# Patient Record
Sex: Male | Born: 1935 | Race: White | Hispanic: No | Marital: Married | Smoking: Former smoker
Health system: Southern US, Community
[De-identification: ages and names within clinical notes are randomized; demographics above are authoritative.]

## PROBLEM LIST (undated history)

## (undated) DIAGNOSIS — K5900 Constipation, unspecified: Secondary | ICD-10-CM

## (undated) DIAGNOSIS — M1990 Unspecified osteoarthritis, unspecified site: Secondary | ICD-10-CM

## (undated) DIAGNOSIS — E291 Testicular hypofunction: Secondary | ICD-10-CM

## (undated) DIAGNOSIS — I10 Essential (primary) hypertension: Secondary | ICD-10-CM

## (undated) DIAGNOSIS — E785 Hyperlipidemia, unspecified: Secondary | ICD-10-CM

## (undated) DIAGNOSIS — F319 Bipolar disorder, unspecified: Secondary | ICD-10-CM

## (undated) DIAGNOSIS — E119 Type 2 diabetes mellitus without complications: Secondary | ICD-10-CM

## (undated) DIAGNOSIS — F32A Depression, unspecified: Secondary | ICD-10-CM

## (undated) DIAGNOSIS — N529 Male erectile dysfunction, unspecified: Secondary | ICD-10-CM

## (undated) DIAGNOSIS — I2510 Atherosclerotic heart disease of native coronary artery without angina pectoris: Secondary | ICD-10-CM

## (undated) DIAGNOSIS — G4733 Obstructive sleep apnea (adult) (pediatric): Secondary | ICD-10-CM

## (undated) HISTORY — PX: WISDOM TOOTH EXTRACTION: SHX21

## (undated) HISTORY — DX: Essential (primary) hypertension: I10

## (undated) HISTORY — PX: OTHER SURGICAL HISTORY: SHX169

## (undated) HISTORY — PX: EYE SURGERY: SHX253

## (undated) HISTORY — DX: Atherosclerotic heart disease of native coronary artery without angina pectoris: I25.10

## (undated) HISTORY — DX: Hyperlipidemia, unspecified: E78.5

## (undated) HISTORY — DX: Unspecified osteoarthritis, unspecified site: M19.90

## (undated) HISTORY — DX: Obstructive sleep apnea (adult) (pediatric): G47.33

## (undated) HISTORY — DX: Male erectile dysfunction, unspecified: N52.9

## (undated) HISTORY — DX: Bipolar disorder, unspecified: F31.9

## (undated) HISTORY — DX: Testicular hypofunction: E29.1

## (undated) HISTORY — PX: COLONOSCOPY: SHX174

---

## 1999-06-03 ENCOUNTER — Ambulatory Visit: Admission: RE | Admit: 1999-06-03 | Discharge: 1999-06-03 | Payer: Self-pay | Admitting: *Deleted

## 1999-07-18 ENCOUNTER — Ambulatory Visit: Admission: RE | Admit: 1999-07-18 | Discharge: 1999-07-18 | Payer: Self-pay | Admitting: *Deleted

## 2001-08-11 HISTORY — PX: CARDIAC CATHETERIZATION: SHX172

## 2001-09-27 ENCOUNTER — Inpatient Hospital Stay (HOSPITAL_COMMUNITY): Admission: AD | Admit: 2001-09-27 | Discharge: 2001-09-29 | Payer: Self-pay | Admitting: Internal Medicine

## 2001-09-27 ENCOUNTER — Encounter: Payer: Self-pay | Admitting: Internal Medicine

## 2001-11-02 ENCOUNTER — Encounter (HOSPITAL_COMMUNITY): Admission: RE | Admit: 2001-11-02 | Discharge: 2002-01-31 | Payer: Self-pay | Admitting: *Deleted

## 2002-07-21 ENCOUNTER — Ambulatory Visit (HOSPITAL_COMMUNITY): Admission: RE | Admit: 2002-07-21 | Discharge: 2002-07-21 | Payer: Self-pay | Admitting: Gastroenterology

## 2010-09-05 LAB — URINALYSIS, ROUTINE W REFLEX MICROSCOPIC
Hgb urine dipstick: NEGATIVE
Ketones, ur: NEGATIVE mg/dL
Nitrite: NEGATIVE
Protein, ur: NEGATIVE mg/dL
Specific Gravity, Urine: 1.024 (ref 1.005–1.030)
Urine Glucose, Fasting: NEGATIVE mg/dL
Urobilinogen, UA: 1 mg/dL (ref 0.0–1.0)
pH: 5.5 (ref 5.0–8.0)

## 2010-09-05 LAB — CBC
HCT: 45.5 % (ref 39.0–52.0)
Hemoglobin: 15 g/dL (ref 13.0–17.0)
MCH: 29.2 pg (ref 26.0–34.0)
MCHC: 33 g/dL (ref 30.0–36.0)
MCV: 88.7 fL (ref 78.0–100.0)
Platelets: 182 10*3/uL (ref 150–400)
RBC: 5.13 MIL/uL (ref 4.22–5.81)
RDW: 13.3 % (ref 11.5–15.5)
WBC: 8.7 10*3/uL (ref 4.0–10.5)

## 2010-09-05 LAB — APTT: aPTT: 29 seconds (ref 24–37)

## 2010-09-05 LAB — ABO/RH: ABO/RH(D): A POS

## 2010-09-05 LAB — DIFFERENTIAL
Basophils Absolute: 0.1 10*3/uL (ref 0.0–0.1)
Basophils Relative: 1 % (ref 0–1)
Eosinophils Absolute: 0.2 10*3/uL (ref 0.0–0.7)
Eosinophils Relative: 3 % (ref 0–5)
Lymphocytes Relative: 26 % (ref 12–46)
Lymphs Abs: 2.2 10*3/uL (ref 0.7–4.0)
Monocytes Absolute: 0.7 10*3/uL (ref 0.1–1.0)
Monocytes Relative: 8 % (ref 3–12)
Neutro Abs: 5.4 10*3/uL (ref 1.7–7.7)
Neutrophils Relative %: 63 % (ref 43–77)

## 2010-09-05 LAB — BASIC METABOLIC PANEL
GFR calc non Af Amer: >60 mL/min (ref >60)
Potassium: 4.1 mEq/L (ref 3.5–5.1)
Sodium: 141 mEq/L (ref 135–145)

## 2010-09-05 LAB — TYPE AND SCREEN: ABO/RH(D): A POS

## 2010-09-05 LAB — PROTIME-INR
INR: 0.95 (ref 0.00–1.49)
Prothrombin Time: 12.9 seconds (ref 11.6–15.2)

## 2010-09-09 ENCOUNTER — Inpatient Hospital Stay (HOSPITAL_COMMUNITY)
Admission: RE | Admit: 2010-09-09 | Discharge: 2010-09-12 | DRG: 470 | Disposition: A | Discharge status: Home Health | Payer: Medicare Other | Attending: Orthopedic Surgery | Admitting: Orthopedic Surgery

## 2010-09-09 DIAGNOSIS — Z9861 Coronary angioplasty status: Secondary | ICD-10-CM

## 2010-09-09 DIAGNOSIS — Z87891 Personal history of nicotine dependence: Secondary | ICD-10-CM

## 2010-09-09 DIAGNOSIS — Z7982 Long term (current) use of aspirin: Secondary | ICD-10-CM

## 2010-09-09 DIAGNOSIS — G4733 Obstructive sleep apnea (adult) (pediatric): Secondary | ICD-10-CM | POA: Diagnosis present

## 2010-09-09 DIAGNOSIS — M1710 Unilateral primary osteoarthritis, unspecified knee: Principal | ICD-10-CM | POA: Diagnosis present

## 2010-09-09 DIAGNOSIS — Z7901 Long term (current) use of anticoagulants: Secondary | ICD-10-CM

## 2010-09-09 DIAGNOSIS — I10 Essential (primary) hypertension: Secondary | ICD-10-CM | POA: Diagnosis present

## 2010-09-09 DIAGNOSIS — I2510 Atherosclerotic heart disease of native coronary artery without angina pectoris: Secondary | ICD-10-CM | POA: Diagnosis present

## 2010-09-09 DIAGNOSIS — E7800 Pure hypercholesterolemia, unspecified: Secondary | ICD-10-CM | POA: Diagnosis present

## 2010-09-10 LAB — CBC
MCH: 29.5 pg (ref 26.0–34.0)
MCV: 90.5 fL (ref 78.0–100.0)
Platelets: 172 10*3/uL (ref 150–400)
RDW: 13.8 % (ref 11.5–15.5)

## 2010-09-10 LAB — BASIC METABOLIC PANEL
BUN: 13 mg/dL (ref 6–23)
Chloride: 103 mEq/L (ref 96–112)
Creatinine, Ser: 1.1 mg/dL (ref 0.4–1.5)
GFR calc non Af Amer: >60 mL/min (ref >60)

## 2010-09-10 LAB — PROTIME-INR: Prothrombin Time: 14.7 seconds (ref 11.6–15.2)

## 2010-09-10 NOTE — Op Note (Addendum)
Charles Bonilla, Charles Bonilla               ACCOUNT NO.:  [id number]  MEDICAL RECORD NO.:  [id number]          PATIENT TYPE:  INP  LOCATION:  5014                         FACILITY:  [facility]  PHYSICIAN:  Feliberto Gottron. [name], M.D.   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  POSTOPERATIVE DIAGNOSIS:  End-stage arthritis, right knee with 20-degree flexion contracture and 10-degree varus deformity.  PROCEDURE:  Right total knee arthroplasty using DePuy Sigma RP components, all cemented double batch of DePuy HV cement, 5 right femur, 6 tibia, 10-mm Sigma RP bearing, 41-mm patellar button.  SURGEON:  Feliberto Gottron. [name], MD  FIRST ASSISTANT:  Shirl Harris, PA-C  ANESTHETIC:  General endotracheal.  ESTIMATED BLOOD LOSS:  Minimal.  FLUID REPLACEMENT:  1500 mL of crystalloid.  DRAINS PLACED:  Two medium Hemovacs, Foley catheter.  URINE OUTPUT:  300 mL.  TOURNIQUET TIME:  1 hour and 30 minutes.  INDICATIONS FOR PROCEDURE:  A 77 year old male with end-stage arthritis of both knees with 10-degree varus deformity and 20-degree flexion contractures bilaterally, desires elective total knee arthroplasty. Risks and benefits of surgery have been discussed, question was answered.  The patient was also failed conservative measures with anti- inflammatory medicines, physical therapy, cortisone injections and Viscosupplementation, and now because of severe unremitting pain with the flexion contractures that make him fatigue easily.  He desires elective right total knee arthroplasty.  Risks and benefits of surgery have been discussed, questions answered.  DESCRIPTION OF PROCEDURE:  The patient was identified by armband and received preoperative IV antibiotics in the holding area at [facility].  He then received right femoral nerve block and was taken to operating room #5, appropriate anesthetic monitors were attached and endotracheal anesthesia induced with the patient in supine position. Foot positioner, lateral post applied to the table.  Tourniquet applied high to the right thigh, which was then prepped and draped in usual sterile fashion from the ankle to the tourniquet.  Time-out procedure was performed.  The limb was wrapped with an Esmarch bandage, tourniquet inflated to 350 mmHg and we began the procedure by making the anterior midline incision starting one handbreadth above the patella going over the patella, 1 cm medial to and 5 cm distal to the tibial tubercle through the skin and subcutaneous tissue down to the level of the transverse retinaculum, which was incised and reflected medially.  We then performed medial parapatellar arthrotomy.  Patella was everted, prepatellar fat pad resected.  Superficial medial collateral ligament was elevated from anterior to posterior off the proximal flare of the tibia going down for a distance of almost 6 or 7 cm to make sure that we had fair amount of release on the medial side.  We also removed the peripheral osteophytes from the anterior and medial tibia.  At this point, the knee was hyperflexed and we removed peripheral osteophytes from the patella and femur at this time as well as the notch osteophytes.  The ACL  and the PCL were resected as were the anterior one half of the menisci.  Tibial spine was removed with an osteotome.  We then entered the proximal tibia with the DePuy step drill followed by intramedullary guide and then fixed a 2-degree posterior slope cutting guide to the tibia allowing resection of about 5 mm of bone medially and 12 mm of bone laterally because of flexion contracture.  The proximal tibial cut was then performed after the guide rod had then removed.  We entered the distal femur 2 mm  anterior to the PCL origin with a step drill followed by the intramedullary rod set for a 12-mm distal cut 5 degrees right and pinned along the epicondylar axis.  After we performed the distal femoral cut, we sized for a #5 femur with posterior referencing cutting guide and set at 3 degrees of external rotation. The #5 chamfer cutting block was then screwed into place.  We performed the anterior, posterior and chamfer cuts without difficulty followed by the Sigma RP box cut.  The knee was brought into full extension and a 10- mm block did fit and there was good ligamentous stability noted in extension and flexion.  The patella measured out at almost 29 mm.  The cutting guide was set at 17 and the posterior 12 mm of the patella resected.  We then sized for a 41 button and drilled the patella.  At this point, we removed the posterior one half of the menisci with the knee in full extension with traction as well as many remnants of the PCL.  The knee was then brought up to full flexion and external rotation.  We sized for a #6 tibial baseplate.  This was pinned into place followed by the smokestack, conical reamer and Delta fin keel punch.  We hammered into a place a #5 right distal femoral trial component and drilled the lugs, placed a 41-mm button and took the knee through a range of motion.  Excellent stability was noted from 0 to 130 degrees where there was adipose tissue translating the tibia forward. At this point, all trial components were removed, all bony surfaces were water picked, clean dried with suction and sponges, double batch of DePuy HV cement with 1500 mg of Zinacef was mixed and applied to all bony metallic mating surfaces except for the posterior condyles of the femur itself.  In order, we hammered into place a #6 tibial tray, a #5 right femur and a 41-mm patellar button and removed the excess cement from all components.  We then inserted the 10-mm bearing, held the  knee in full extension with compression as the cement cured and the patella was clamped into place.  Medium Hemovac were placed from the anterolateral approach proximally and the wound irrigated out with normal saline solution pulse lavaged one more time.  Parapatellar arthrotomy was closed with running #1 Vicryl suture, the subcutaneous tissue with 0 and 2-0 undyed Vicryl suture and the skin with skin staples.  Dressing of Xeroform, 4x4 dressings, sponges, Webril and an Ace wrap applied, a short knee immobilizer was then also applied because of the preexisting flexion contracture.  The patient was awakened, extubated and taken to the recovery room after the tourniquet was let down.     Feliberto Gottron. [name], M.D.     Ovid Curd  D:  09/09/2010  T:  09/09/2010  Job:  [id number]  Electronically Signed by Gean Birchwood M.D. on 09/10/2010 08:23:04 PM

## 2010-09-11 LAB — CBC
HCT: 35.5 % — ABNORMAL LOW (ref 39.0–52.0)
Hemoglobin: 11.4 g/dL — ABNORMAL LOW (ref 13.0–17.0)
MCV: 90.8 fL (ref 78.0–100.0)
RDW: 13.7 % (ref 11.5–15.5)
WBC: 14.9 10*3/uL — ABNORMAL HIGH (ref 4.0–10.5)

## 2010-09-12 LAB — CBC
HCT: 32.8 % — ABNORMAL LOW (ref 39.0–52.0)
Hemoglobin: 10.5 g/dL — ABNORMAL LOW (ref 13.0–17.0)
MCH: 28.5 pg (ref 26.0–34.0)
MCHC: 32 g/dL (ref 30.0–36.0)
MCV: 89.1 fL (ref 78.0–100.0)
Platelets: 156 10*3/uL (ref 150–400)
RBC: 3.68 MIL/uL — ABNORMAL LOW (ref 4.22–5.81)
RDW: 13.6 % (ref 11.5–15.5)
WBC: 13.4 10*3/uL — ABNORMAL HIGH (ref 4.0–10.5)

## 2010-10-11 NOTE — Discharge Summary (Signed)
  NAMEJERSON, FURUKAWA               ACCOUNT NO.:  [id number]  MEDICAL RECORD NO.:  [id number]           PATIENT TYPE:  I  LOCATION:  5014                         FACILITY:  [facility]  PHYSICIAN:  Feliberto Gottron. [name], M.D.   DATE OF BIRTH:  [birth year]  DATE OF ADMISSION:  09/09/2010 DATE OF DISCHARGE:  09/12/2010                              DISCHARGE SUMMARY   CHIEF COMPLAINT:  Right knee pain.  HISTORY OF PRESENT ILLNESS:  This is a 77 year old  gentleman who complains of  severe unremitting pain in his right knee despite conservative treatment.  He desires surgical intervention at this time. All risks and benefits of surgery were discussed with the patient.  PAST MEDICAL HISTORY:  Significant for sleep apnea, coronary artery disease, high cholesterol, and hypertension.  PAST SURGICAL HISTORY:  Significant for cardiac stent.  ALLERGIES:  He has no known drug allergies.  SOCIAL HISTORY:  He quit smoking and drinks occasional alcohol.  FAMILY HISTORY:  Noncontributory.  PHYSICAL EXAMINATION:  Examination of the right knee demonstrates a varus deformity.  Range of motion is intact at 110 degrees.  He is neurovascularly intact.  X-rays demonstrate bone-on-bone degenerative joint disease in the medial compartment of the right knee.  PREOPERATIVE LABORATORY DATA:  White blood cells 7.7, platelets 92, PT 12.9, INR 0.9, PTT 29.   Sodium 141, potassium 4.1, chloride 104, glucose 78, BUN 15, creatinine 0.95.  Urinalysis was within normal limits.  HOSPITAL COURSE:  Mr. Fukuda was admitted to [facility] on September 09, 2010, when he underwent right total knee arthroplasty.  The procedure was performed by Dr. Gean Birchwood, and the patient tolerated the procedure well.  A hemovac drain was placed in the right knee.  He was transferred to the floor on Lovenox and Coumadin for DVT prophylaxis.  On the first postoperative day, he was awake and alert, and reporting good pain control. Hemoglobin  was 13.  His drain was pulled without difficulty.  Foley catheter was removed after physical therapy.  On the second postoperative day, he was ambulating with physical therapy.  Hemoglobin was 11.4.  Surgical dressing was changed, and his incision was found to be benign.  There was a blister on the anterior aspect of the tibia just distal to the incision site.  On the third postoperative day, he was eating well and ambulating independently.  Hemoglobin was 10.5. Dressing remained clean, and he was discharged home.  DISPOSITION:  The patient was discharged home on September 12, 2010.  He was weightbearing as tolerated and would return to the clinic in 10 days for xrays and staple removal.  Discharge medicines were as per the HMR with the addition of Percocet and Coumadin.  FINAL DIAGNOSES:  End-stage degenerative joint disease of the right knee.     Shirl Harris, PA   ______________________________ Feliberto Gottron. [name], M.D.    JW/MEDQ  D:  10/09/2010  T:  10/10/2010  Job:  [id number]  Electronically Signed by Shirl Harris PA on 10/10/2010 05:58:13 PM Electronically Signed by Gean Birchwood M.D. on 10/11/2010 06:38:49 AM

## 2010-11-11 ENCOUNTER — Other Ambulatory Visit (HOSPITAL_COMMUNITY): Payer: Medicare Other

## 2010-11-14 ENCOUNTER — Encounter (HOSPITAL_COMMUNITY)
Admission: RE | Admit: 2010-11-14 | Discharge: 2010-11-14 | Disposition: A | Discharge status: Home / Self Care | Payer: Medicare Other | Source: Ambulatory Visit | Attending: Orthopedic Surgery | Admitting: Orthopedic Surgery

## 2010-11-14 LAB — URINALYSIS, ROUTINE W REFLEX MICROSCOPIC
Glucose, UA: NEGATIVE mg/dL
Hgb urine dipstick: NEGATIVE
Specific Gravity, Urine: 1.02 (ref 1.005–1.030)
pH: 6 (ref 5.0–8.0)

## 2010-11-14 LAB — TYPE AND SCREEN: ABO/RH(D): A POS

## 2010-11-14 LAB — BASIC METABOLIC PANEL
BUN: 11 mg/dL (ref 6–23)
Creatinine, Ser: 0.75 mg/dL (ref 0.4–1.5)
GFR calc non Af Amer: >60 mL/min (ref >60)
Glucose, Bld: 87 mg/dL (ref 70–99)

## 2010-11-14 LAB — CBC
HCT: 41.6 % (ref 39.0–52.0)
Hemoglobin: 13.4 g/dL (ref 13.0–17.0)
MCH: 28 pg (ref 26.0–34.0)
MCHC: 32.2 g/dL (ref 30.0–36.0)
MCV: 87 fL (ref 78.0–100.0)

## 2010-11-14 LAB — URINE MICROSCOPIC-ADD ON

## 2010-11-14 LAB — PROTIME-INR: Prothrombin Time: 13.6 seconds (ref 11.6–15.2)

## 2010-11-14 LAB — SURGICAL PCR SCREEN: MRSA, PCR: NEGATIVE

## 2010-11-18 ENCOUNTER — Inpatient Hospital Stay (HOSPITAL_COMMUNITY)
Admission: RE | Admit: 2010-11-18 | Discharge: 2010-11-21 | DRG: 470 | Disposition: A | Discharge status: Home / Self Care | Payer: Medicare Other | Source: Ambulatory Visit | Attending: Orthopedic Surgery | Admitting: Orthopedic Surgery

## 2010-11-18 DIAGNOSIS — G4733 Obstructive sleep apnea (adult) (pediatric): Secondary | ICD-10-CM | POA: Diagnosis present

## 2010-11-18 DIAGNOSIS — J449 Chronic obstructive pulmonary disease, unspecified: Secondary | ICD-10-CM | POA: Diagnosis present

## 2010-11-18 DIAGNOSIS — I10 Essential (primary) hypertension: Secondary | ICD-10-CM | POA: Diagnosis present

## 2010-11-18 DIAGNOSIS — J4489 Other specified chronic obstructive pulmonary disease: Secondary | ICD-10-CM | POA: Diagnosis present

## 2010-11-18 DIAGNOSIS — M1710 Unilateral primary osteoarthritis, unspecified knee: Principal | ICD-10-CM | POA: Diagnosis present

## 2010-11-18 DIAGNOSIS — I2510 Atherosclerotic heart disease of native coronary artery without angina pectoris: Secondary | ICD-10-CM | POA: Diagnosis present

## 2010-11-18 DIAGNOSIS — Z01812 Encounter for preprocedural laboratory examination: Secondary | ICD-10-CM

## 2010-11-18 DIAGNOSIS — Z87891 Personal history of nicotine dependence: Secondary | ICD-10-CM

## 2010-11-18 DIAGNOSIS — Z7901 Long term (current) use of anticoagulants: Secondary | ICD-10-CM

## 2010-11-18 DIAGNOSIS — Z9861 Coronary angioplasty status: Secondary | ICD-10-CM

## 2010-11-18 DIAGNOSIS — E669 Obesity, unspecified: Secondary | ICD-10-CM | POA: Diagnosis present

## 2010-11-19 LAB — CBC
HCT: 35.9 % — ABNORMAL LOW (ref 39.0–52.0)
MCV: 87.1 fL (ref 78.0–100.0)
Platelets: 179 10*3/uL (ref 150–400)
RBC: 4.12 MIL/uL — ABNORMAL LOW (ref 4.22–5.81)
RDW: 14.5 % (ref 11.5–15.5)
WBC: 12.2 10*3/uL — ABNORMAL HIGH (ref 4.0–10.5)

## 2010-11-19 LAB — BASIC METABOLIC PANEL
BUN: 11 mg/dL (ref 6–23)
Chloride: 102 mEq/L (ref 96–112)
GFR calc non Af Amer: >60 mL/min (ref >60)
Potassium: 3.9 mEq/L (ref 3.5–5.1)
Sodium: 135 mEq/L (ref 135–145)

## 2010-11-19 LAB — PROTIME-INR: INR: 1.12 (ref 0.00–1.49)

## 2010-11-19 NOTE — Op Note (Signed)
Charles Bonilla, Charles Bonilla               ACCOUNT NO.:  [id number]  MEDICAL RECORD NO.:  [id number]           PATIENT TYPE:  I  LOCATION:  5021                         FACILITY:  [facility]  PHYSICIAN:  Feliberto Gottron. [name], M.D.   DATE OF BIRTH:  11-May-1934  DATE OF PROCEDURE:  11/18/2010 DATE OF DISCHARGE:                              OPERATIVE REPORT   PREOPERATIVE DIAGNOSIS:  End-stage arthritis, left knee.  POSTOPERATIVE DIAGNOSIS:  End-stage arthritis, left knee.  PROCEDURE:  Left total knee arthroplasty using DePuy Sigma RP components, 6 tibia, 5 femur, 41-mm patellar button, 10-mm Sigma RP bearing double batch of DePuy HV cement with 1500 mg of Zinacef.  SURGEON:  Feliberto Gottron. [name], MD  FIRST ASSISTANT:  Shirl Harris, PA-C  ANESTHETIC:  General endotracheal.  ESTIMATED BLOOD LOSS:  400 mL.  FLUID REPLACEMENT:  2 liters of crystalloid.  DRAINS PLACED:  Foley catheter.  URINE OUTPUT:  300 mL and two medium Hemovacs.  TOURNIQUET TIME:  18 minutes.  INDICATIONS FOR PROCEDURE:  A 77 year old male with end-stage arthritis of the left knee and right total knee few months ago, did have some epidermal blistering of the skin along the leg, so the plan for this knee was to do it primarily without the tourniquet except for when the cement was actually being placed.  Risks and benefits of surgery well known to the patient.  He has bone-on-bone arthritic changes and severe disabling pain.  He has done very well of his right total knee after the skin blisters healed.  DESCRIPTION OF PROCEDURE:  The patient was identified by armband, received preoperative IV antibiotics in the holding area at [facility] followed by a left femoral nerve block, taken to operating room #4, appropriate anesthetic monitors were attached.  General endotracheal anesthesia was induced with the patient in supine position.  Lateral post and foot positioner were applied to the table.  Foley catheter  was inserted.  Tourniquet was applied high of the left thigh, left lower extremity, prepped and draped in usual sterile fashion from the ankle to the midthigh.  Time-out procedure was performed.  We began the operation by making the anterior midline incision starting a handbreadth above the patella, going over the patella 1 cm medial, 2 and 3 cm distal to the tibial tubercle bleeder.  Bleeders in the skin and subcutaneous tissue identified and cauterized.  Transverse retinaculum was incised and reflected medially, medial parapatellar arthrotomy was then accomplished.  Bleeders in the tendon were also cauterized.  Superficial medial collateral ligament elevated from anterior-posterior off the medial flare of the tibia.  Prepatellar fat pad resected, patella everted and the knee hyperflexed.  Medial and lateral menisci were then resected in an open manner as were the cruciate ligaments.  The knee was then externally rotated and hyperflexed exposing the proximal tibia with the patella everted.  We entered the tibial canal in line with the axis of the tibia with a DePuy step drill followed by intramedullary guide rod and 2-degrees posterior slope cutting guide was then pinned allowing resection of 4-5 mm of bone medially and 8-9 mm of bone laterally.  Satisfied with the proximal tibial cut.  We then entered the distal femur 2 mm anterior to the PCL origin with the IM rod.  A 5-degree left distal femoral cutting guide set at 12 mm because of the large size as in the contralateral knee, was then pinned along the epicondylar axis and the distal 12 mm of the femur resected.  We sized for #5 femoral component using the posterior referencing cutting guide and used a 3- degree externally rotated guide because of the tightness medially.  We then pinned in place the chamfer cutting guide, performed the anterior, posterior and chamfer cuts without difficulty followed by the Sigma RP box cut.  The knee  was then brought into full extension allowing [place] to remove the posterior one half of the menisci and checked our extension gap, which was good for a 10-mm bearing.  The flexion gap was also good for a 10-mm bearing.  At this point, the knee was once again hyperflexed.  A trial 6 tibial baseplate was inserted, pinned into place followed by the smokestack conical reamer and Delta fin keel punch.  The patella was measured at 29 mm.  The cutting guide was set at 17 and the posterior 11 mm of the patella resected drilled for 41 button and a trial button was placed.  We then hammered into place a 5 left femoral trial, drilled the lugs, and inserted a 10-mm bearing and took the knee through range of motion from 0 to 130 degrees with good patellar tracking noted and no thumb pressure required.  With the trial components in the knee was then wrapped with an Esmarch bandage, tourniquet inflated to 350 mmHg.  Trial components were removed.  All bony surfaces were water picked, cleaned, dried with suction and sponges.  Double batch of DePuy HV cement was mixed at the back table and applied to all bony and metallic mating surfaces except for the posterior condyles of the femur itself.  We then hammered into place a six tibial baseplate and removed the excess cement, a 5 left femoral component and removed the excess cement.  We inserted the 10-mm Sigma RP bearing, brought the knee to full extension, gave a good squeeze, flexed to 45 and removed more cement that came out at the edges of the femoral and tibial components.  A 41 patellar button was then squeezed into place and excess cement was removed.  The knee was once again thoroughly irrigated out with normal saline solution.  Medium Hemovac drains were placed from anterolateral approach and the tourniquet let down after the cement cured.  Small bleeders were once again identified and cauterized. The parapatellar arthrotomy was closed with running #1  Vicryl suture, the subcutaneous tissue with 0 and 2-0 undyed Vicryl suture and the skin with skin staples.  A dressing of Xeroform, 4x4 dressing, sponges, Webril and Ace wrap applied.  The patient was then awakened, extubated and taken to the recovery room without difficulty.     Feliberto Gottron. [name], M.D.     Ovid Curd  D:  11/18/2010  T:  11/18/2010  Job:  [id number]  Electronically Signed by Gean Birchwood M.D. on 11/19/2010 03:51:05 PM

## 2010-11-20 LAB — CBC
HCT: 35.2 % — ABNORMAL LOW (ref 39.0–52.0)
Hemoglobin: 10.9 g/dL — ABNORMAL LOW (ref 13.0–17.0)
MCV: 88.2 fL (ref 78.0–100.0)
RDW: 14.4 % (ref 11.5–15.5)
WBC: 11.8 10*3/uL — ABNORMAL HIGH (ref 4.0–10.5)

## 2010-11-20 LAB — PROTIME-INR: INR: 1.24 (ref 0.00–1.49)

## 2010-11-21 LAB — PROTIME-INR
INR: 1.17 (ref 0.00–1.49)
Prothrombin Time: 15.1 seconds (ref 11.6–15.2)

## 2010-11-21 LAB — CBC
Hemoglobin: 10.3 g/dL — ABNORMAL LOW (ref 13.0–17.0)
RBC: 3.77 MIL/uL — ABNORMAL LOW (ref 4.22–5.81)

## 2010-12-02 NOTE — Discharge Summary (Signed)
NAMEGRANGER, Charles Bonilla               ACCOUNT NO.:  [id number]  MEDICAL RECORD NO.:  [id number]           PATIENT TYPE:  I  LOCATION:  5021                         FACILITY:  [facility]  PHYSICIAN:  Feliberto Gottron. [name], M.D.   DATE OF BIRTH:  10/29/1933  DATE OF ADMISSION:  11/18/2010 DATE OF DISCHARGE:  11/21/2010                              DISCHARGE SUMMARY   CHIEF COMPLAINT:  Left knee pain.  HISTORY OF PRESENT ILLNESS:  This is a 77 year old gentleman who complains of severe unremitting pain in his left knee despite conservative treatment.  He has history of successful right total knee arthroplasty and now desires a surgical intervention on the contralateral side.  PAST MEDICAL HISTORY:  Significant for sleep apnea, coronary artery disease, high cholesterol and hypertension.  PAST SURGICAL HISTORY:  Significant for cardiac stent and right total knee arthroplasty.  ALLERGIES:  He has no known drug allergies.  SOCIAL HISTORY:  He quit smoking several years ago and drinks occasional alcohol.  FAMILY HISTORY:  Noncontributory.  PHYSICAL EXAMINATION:  Gross examination of the left knee demonstrates a varus deformity.  Range of motion is 5-110 degrees.  He is neurovascularly intact.  X-rays demonstrate bone-on-bone degenerative joint disease in the medial compartment of the left knee.  PREOP LABS:  White blood cells 9.8, red blood cells 4.78, hemoglobin 13.4, hematocrit 41.6, platelets 303,000.  PT 13.6, INR 1.02, PTT 31. Sodium 140, potassium 4.0, chloride 103, glucose 87, BUN 11, creatinine 0.75.  Urinalysis was within normal limits.  HOSPITAL COURSE:  Charles Bonilla was admitted to [facility] on November 18, 2010, when he underwent left total knee arthroplasty, the procedure was performed by Dr. Gean Birchwood and the patient tolerated it well.  Two Hemovac drains were placed into the left knee.  Perioperative Foley catheter was also placed. He was transferred to the floor on Lovenox  and Coumadin for DVT prophylaxis.  On the first postoperative day, he was awake and alert.  He denied any nausea or vomiting and he was reporting good pain control.  Hemoglobin was 11.3.  Foley catheter was taken out after physical therapy.  On the second postoperative day, he was making good progress with physical therapy.  Hemoglobin was 10.9.  Surgical dressing was changed and his incision was benign.  On postoperative day #3, he was eating well and ambulating independently with a walker and was discharged home.  Hemoglobin on that day was 10.3.  DISPOSITION:  The patient was discharged home on November 21, 2010.  He was weightbearing as tolerated and would return to the clinic in 10 days for x-rays and staple removal.  Discharge medicines are as per the HMR with the additions of Percocet and Coumadin.  He would take Coumadin for 2 weeks with a target INR of 1.52-2.0.  FINAL DIAGNOSIS:  End-stage degenerative joint disease of the left knee     Shirl Harris, PA   ______________________________ Feliberto Gottron. [name], M.D.    JW/MEDQ  D:  11/28/2010  T:  11/29/2010  Job:  [id number]  Electronically Signed by Shirl Harris PA on 11/29/2010 01:19:43 PM Electronically Signed by Homero Fellers  Letroy Vazguez M.D. on 12/02/2010 02:15:23 PM

## 2010-12-27 NOTE — Cardiovascular Report (Signed)
[place]  Patient:    Charles Bonilla, Charles Bonilla Visit Number: [id number] MRN: [id number]          Service Type: MED Location: [phone] Attending Physician:  Lillia Mountain Dictated by:   Darci Needle, M.D. Proc. Date: 09/28/01 Admit Date:  09/27/2001   CC:         Meade Maw, M.D.  Thora Lance, M.D.   Cardiac Catheterization  INDICATIONS:  Acute coronary syndrome with high grade stenosis in large ramus intermedius branch prior to bifurcation.  PROCEDURE: 1. Cutting balloon angioplasty. 2. Stent deployment.  DESCRIPTION OF PROCEDURE:  After the diagnostic procedure, the patients digital angiograms were reviewed.  It was felt that the ramus branch was the likely culprit for the patients presenting coronary symptoms.  He was brought back to the catheterization lab, the sheath upgraded to a 7 French sheath utilizing double glove technique and the modified Seldinger technique.  We used a 7 [place] 3.0 Voda guide.  Guide support was marginal.  The BMW wire was used to the cross the stenosis and the lesion and the 6 mm long 3.5 mm diameter cutting balloon was used for predilatation.  We then deployed a 12 mm long 3.5 mm Express II stent to 16 atmospheres which was an effective diameter of 3.9 mm.  There was still very gentle indentation in the superior margin of the stent in the RAO caudal view, however, the lumen was felt to be quite acceptable and there was significant stenosis following two balloon inflations.  Final angiographic result is felt to reflect a less than 10% stenosis.  TIMI grade flow was 2.  The patient received 3500 units of IV heparin.  He had received subcu inoxoperin at 6 a.m. so actually this procedure was being done with the patient still slightly short of six hours following the last Lovenox dose. A double bolus of Integrilin followed by infusion was begun.  ACT postprocedure 236 seconds.  CONCLUSION:   Successful PCI on the ramus intermedius branch with reduction in stenosis from 99% to less than 10% with TIMI grade 3 flow.  PLAN:  Aspirin, Plavix.  Integrilin x18 hours.  Follow renal function. Follow hemoglobin. Dictated by:   Darci Needle, M.D. Attending Physician:  Lillia Mountain DD:  09/28/01 TD:  09/28/01 Job: [id number]

## 2010-12-27 NOTE — Consult Note (Signed)
[place]. [facility]  Patient:    Charles Bonilla, Charles Bonilla Visit Number: [id number] MRN: [id number]          Service Type: MED Location: [phone] Attending Physician:  Lillia Mountain Dictated by:   Meade Maw, M.D. Admit Date:  09/27/2001                            Consultation Report  REFERRING PHYSICIAN:  Thora Lance, M.D.  REASON FOR CONSULTATION:  Chest pain.  HISTORY:  Charles Bonilla is a very pleasant 77 year old gentleman who presented to Dr. Kandyce Rud office on September 27, 2001 with chief complaint of chest pain.  While in [place] he developed a diffuse chest pain described as a pressure sensation.  The chest pain would occur with exertion while walking approximately 100 feet, while taking a shower, etc.  There was associated shortness of breath and general feelings of fatigue.  There was some presyncope but no syncope.  The chest pressure was relieved with rest.  He has had approximately one to two episodes per day.  He also relates a history of increased shortness of breath during the night.  He related this to his CPAP machine being nonfunctional.  His coronary risk factors are significant for male; age; remote history of tobacco use - stopped smoking approximately 10 years ago, had a prior history of one pack per day; sedentary activity. Cholesterol profile is unknown.  PAST MEDICAL HISTORY: 1. Erectile dysfunction. 2. Obesity. 3. Obstructive sleep apnea. 4. Right-sided sciatica.  PAST SURGICAL HISTORY:  None.  PAST INJURIES:  None.  MEDICATIONS PRIOR TO ADMISSION:  None.  MEDICATIONS NOW:  Include Lovenox, aspirin, Lopressor 25 mg p.o. q.12h., sublingual nitroglycerin.  SOCIAL HISTORY:  He is married, lives with his wife.  Frequently travels with his job.  He works as a Systems analyst.  REVIEW OF SYSTEMS:  He has had increased shortness of breath.  No history of bowel or bladder dysfunction.  He  has noted hematochezia in the past.  There is no known syncope, no fevers, no chills.  He has had one episode of a fast heart rate in the setting of excessive caffeine intake.  PHYSICAL EXAMINATION:  VITAL SIGNS:  Blood pressure 120/70, heart rate 80, he is afebrile.  O2 saturation is 96%.  HEENT:  Remarkable for a thick neck; unable to evaluate for neck vein distention.  He does have good carotid upstrokes, no carotid bruits.  PULMONARY:  Reveals breath sounds which are equal and clear to auscultation. No use of accessory muscles.  CARDIOVASCULAR:  Reveals a normal S1, normal S2, regular rate and rhythm.  No rubs, murmurs, or gallops are noted.  ABDOMEN:  Soft, benign, nontender, obese.  No obvious hepatosplenomegaly.  EXTREMITIES:  Revealed distal pulses which are equal and palpable.  There is no peripheral edema.  NEUROLOGIC:  Nonfocal.  Motor is 5/5 throughout.  LABORATORY DATA:  ECG reveals a right bundle-branch block.  No previous EKG for comparison.  White count 11.4, hematocrit 47, platelet count 203.  INR 1.0.  Electrolytes are within normal limits.  Initial CK 236 with MB 7.25, troponin I 0.37.  Chest x-ray reveals no acute process.  IMPRESSION: 1. Chest pain which is typical for cardiac, with borderline elevation of    cardiac enzymes.  Risks, benefits, and options were discussed with the    patient.  We will proceed with cardiac catheterization for further  stratification.  Agree with the ongoing use of sublingual nitroglycerin,    Lopressor, and Lovenox.  A fasting lipid profile will be obtained. 2. Obstructive sleep apnea.  Patient will use his home CPAP machine while    in-hospital. 3. Morbid obesity.  Diet and exercise were discussed with the patient.  He    appears to be motivated to initiate appropriate diet and exercise. Dictated by:   Meade Maw, M.D. Attending Physician:  Lillia Mountain DD:  09/28/01 TD:  09/28/01 Job: [id number]

## 2010-12-27 NOTE — Op Note (Signed)
   Charles Bonilla, Charles Bonilla                           ACCOUNT NO.:  [id number]   MEDICAL RECORD NO.:  [id number]                   PATIENT TYPE:  AMB   LOCATION:  ENDO                                 FACILITY:  [facility]   PHYSICIAN:  James L. Malon Kindle., M.D.          DATE OF BIRTH:  February 19, 1934   DATE OF PROCEDURE:  07/21/2002  DATE OF DISCHARGE:                                 OPERATIVE REPORT   PROCEDURE:  Colonoscopy.   MEDICATIONS:  Fentanyl 50 mcg, Versed 5 mg IV.   ENDOSCOPE:  Olympus adult video colonoscope.   INDICATIONS:  Colon cancer screening.   DESCRIPTION OF PROCEDURE:  The procedure had been explained to the patient  and consent obtained.  With the patient in the left lateral decubitus  position, the Olympus adult video colonoscope inserted, advanced under  direct visualization.  The prep was excellent, and we were able to reach the  cecum without difficulty and the ileocecal valve and appendiceal orifice  seen.  The scope was withdrawn.  No polyps were seen throughout the entire  colon.  There was no significant diverticular disease.  The scope was  withdrawn in the rectum.  The rectum was free of polyps, really had no  significant internal hemorrhoids.   ASSESSMENT:  Normal screening colonoscopy.   PLAN:  Yearly Hemoccults.  Probably will repeat the procedure in 10 years.                                               James L. Malon Kindle., M.D.    Waldron Session  D:  07/21/2002  T:  07/21/2002  Job:  [id number]   cc:   Thora Lance, M.D.  [address]  [place]  [place] [address]  Fax: [phone]

## 2010-12-27 NOTE — Discharge Summary (Signed)
[place]. [facility]  Patient:    Charles Bonilla, Charles Bonilla Visit Number: [id number] MRN: [id number]          Service Type: Attending:  Thora Lance, M.D.                             Discharge Summary  REASON FOR ADMISSION:  This was a 77 year old [place] male who was admitted after developing chest pain over the last 10 days.  He described a pressure in his chest when walking about 100 feet or taking a shower.  This was associated with shortness of breath, dizziness and feeling of fatigue and was relieved within minutes with rest.  He was having about two episodes a day and was admitted with unstable angina.  Significant findings of his examination was unremarkable.  EKG shows normal sinus rhythm, right bundle branch block and left axis deviation.  LABS ON ADMISSION:  CBC with wbc 11.4, hemoglobin 16.0, platelet count 203. PT 13.4, PTT 28.  Chemistry with sodium 142, potassium 4.0, chloride 107, bicarbonate 27, glucose 121, BUN 12, creatinine 0.9, calcium 8.8.  Total protein 7.4, albumin 2.8, AST 34, ALT 37, alkaline phosphatase 52, total bilirubin 0.6. CK 236, CK-MB 7.2, troponin I 0.37.  Lipids with cholesterol 166, triglycerides 120, HDL 42, LDL 100.  HOSPITAL COURSE:  The patient was admitted with unstable angina/non-Q-wave myocardial infarction.  He was placed on Lovenox, aspirin and beta-blocker. He was seen by Meade Maw, M.D., in the cardiology service.  A cardiac catheterization was done on September 28, 2001, and showed normal LV function and critical disease of a moderate-size first diagonal artery. The patient was seen by Darci Needle, M.D., and underwent a successful angioplasty and stent placement of the lesion.  The patient did well post procedure and was discharged to home on September 29, 2001, in good condition.  DISCHARGE MEDICATIONS: 1. Plavix 75 mg one p.o. q.d. four weeks. 2. Nitroglycerin 0.4 mg sublingual p.r.n. chest  pain. 3. Enteric coated aspirin 325 mg once a day. 4. Zocor 10 mg once a day. 5. Multivitamin once a day.  DIET:  Low fat, low cholesterol.  ACTIVITY:  No heavy lifting and no driving the day of or the day following discharge.  FOLLOW-UP:  The patient will follow up on October 06, 2001, with Dr. Hillary Bow.  Attending:  Thora Lance, M.D. DD:  03/01/02 TD:  03/07/02 Job: [id number]

## 2010-12-27 NOTE — H&P (Signed)
[place]. [facility]  Patient:    Charles Bonilla, Charles Bonilla Visit Number: [id number] MRN: [id number]          Service Type: MED Location: [phone] Attending Physician:  Lillia Mountain Dictated by:   Thora Lance, M.D. Admit Date:  09/27/2001                           History and Physical  CHIEF COMPLAINT:  Chest pain.  HISTORY OF PRESENT ILLNESS:  This is a 77 year old [place] male who over the last 10 days, while on business in [place], developed a diffuse chest pain described as a pressure which occurred with exertion such as walking 100 feet or taking a shower.  The pain was associated with shortness of breath, dizziness, and feeling of tiredness.  It is relived by rest within minutes. He is having about two episodes a day.  He has had several episodes that are more localized retrosternal pain while at rest.  He woke up several times while in [place] with shortness of breath which was relieved by sitting up.  ALLERGIES:  No known drug allergies.  CURRENT MEDICATIONS:  Viagra p.r.n. and C-Pap at 35.  PAST MEDICAL HISTORY:  Erectile dysfunction.  Obstructive sleep apnea. History of right-sided sciatica.  Obesity.  PAST SURGICAL HISTORY:  None.  INJURIES:  None.  FAMILY HISTORY:  Father died at age 52 of an enlarged heart.  Mother died at 36 of a stroke.  Brother died of a brain tumor.  Brother had prostate cancer and possibly obstructive sleep apnea.  Sister with breast cancer.  SOCIAL HISTORY:  Married.  No children.  Occupation:  He is a Loss adjuster, chartered.  He smoked a pack a day for 40 years and quit about 10 years ago.  Drug and alcohol:  One to two glasses of wine a day.  REVIEW OF SYSTEMS:  Otherwise negative.  PHYSICAL EXAMINATION:  GENERAL:  An obese white male.  VITAL SIGNS:  Blood pressure is 142/84, heart rate is 76, temperature 97.7.  HEENT:  Pupils are equal, round and reactive to light.   Extraocular movements are intact.  Funduscopic normal.  Tympanic membranes are clear.  Oropharynx clear.  NECK:  Supple.  No bruits.  No JVD.  No lymphadenopathy.  LUNGS:  Clear.  HEART:  Regular rate and rhythm without murmurs, rubs, or gallops.  ABDOMEN:  Soft, nontender.  Normal bowel sounds.  No masses or bruits.  RECTAL:  Deferred.  EXTREMITIES:  Trace pretibial edema bilaterally.  NEUROLOGICAL:  Nonfocal.  LABORATORY DATA:  Laboratories pending.  Chest x-ray pending.  EKG shows normal sinus rhythm, right bundle branch block, and a left axis deviation.  ASSESSMENT:  Probable unstable angina.  PLAN:  Admit.  Cardiology consult.  Lovenox.  Aspirin.  Beta blocker.  Cardiac enzymes. Dictated by:   Thora Lance, M.D. Attending Physician:  Lillia Mountain DD:  09/27/01 TD:  09/27/01 Job: [id number]

## 2010-12-27 NOTE — Cardiovascular Report (Signed)
[place]. [facility]  Patient:    [name], Charles Bonilla Visit Number: [id number] MRN: [id number]          Service Type: MED Location: [phone] Attending Physician:  Lillia Mountain Dictated by:   Meade Maw, M.D. Proc. Date: 09/28/01 Admit Date:  09/27/2001   CC:         Thora Lance, M.D.                        Cardiac Catheterization  PROCEDURE:  Cardiac catheterization.  CARDIOLOGIST:  Meade Maw, M.D.  INDICATION:  Progressive angina with cardiac enzymes.  DESCRIPTION OF PROCEDURE:  After obtaining written informed consent, the patient was brought to the cardiac catheterization lab in the postabsorptive state.  Preoperative sedation was achieved using IV Versed.  The right groin was prepped and draped in the usual sterile fashion.  Local anesthesia was achieved using 1% Xylocaine.  The 6 French hemostasis sheath was placed into the right femoral artery using the modified Seldinger technique.  Selective coronary angiography was performed using a JL4 and JR4 Judkins catheter.  All catheter exchanges were made over a guidewire.  The distal iliac was noted to be markedly tortuous.  There was kinking of the first JL4 catheter.  The kink was relieved under visual fluoroscopy with a guidewire.  Following the procedure, the films were reviewed with Dr. Earleen Newport and it was felt that percutaneous revascularization was indicated on the first diagonal.  FINDINGS:  The aortic pressure was 124/80, LV pressure was 128/14.  There was no gradient noted on pullback.  Single plane ventriculogram revealed normal wall motion with an ejection fraction of 50-60%.  CORONARY ARTERIOGRAPHY:  Left main:  The left main coronary artery bifurcates into the left anterior descending and circumflex vessel.  There was luminal irregularities in the left main coronary artery.  Left anterior descending:  The left anterior descending has diffuse disease and ends  as a tapering vessel.  The left anterior descending gives rise to a moderate to large first diagonal and a small second diagonal.  The first diagonal is totally occluded proximally.  The second diagonal has a 90% ostial lesion.  There is post stenotic dilatation following the first diagonal.  Circumflex vessel:  The circumflex vessel has diffuse luminal irregularity. It is dominant for the posterior circulation.  There is a 40-50% ostial lesion in the circumflex.  The circumflex gives rise to trivial OM-I, OM-II, small OM-III and a trivial OM-IV.  There is 40% mid vessel lesion noted in the circumflex.  The circumflex goes on to end as the posterior lateral branch. The posterior lateral branch has a 70% lesion.  Right coronary artery:  The right coronary artery is nondominant.  The proximal portion of the vessel is mildly ectatic.  There is a 50% mid vessel lesion.  IMPRESSION:  Critical disease involving the moderate size first diagonal with preserved left ventricular function.  The films were reviewed with Dr. Verdis Prime.  He will proceed with percutaneous revascularization .  We will continue to treat the remaining medical disease in the second diagonal, ostial circumflex, distal circumflex and right coronary artery. Dictated by:   Meade Maw, M.D. Attending Physician:  Lillia Mountain DD:  09/28/01 TD:  09/28/01 Job: [id number]

## 2013-06-06 ENCOUNTER — Other Ambulatory Visit: Payer: Self-pay | Admitting: Interventional Cardiology

## 2013-06-18 ENCOUNTER — Encounter: Payer: Self-pay | Admitting: Interventional Cardiology

## 2013-06-18 ENCOUNTER — Encounter: Payer: Self-pay | Admitting: *Deleted

## 2013-06-18 DIAGNOSIS — N529 Male erectile dysfunction, unspecified: Secondary | ICD-10-CM | POA: Insufficient documentation

## 2013-06-18 DIAGNOSIS — F319 Bipolar disorder, unspecified: Secondary | ICD-10-CM | POA: Insufficient documentation

## 2013-06-18 DIAGNOSIS — I2510 Atherosclerotic heart disease of native coronary artery without angina pectoris: Secondary | ICD-10-CM | POA: Insufficient documentation

## 2013-06-18 DIAGNOSIS — I10 Essential (primary) hypertension: Secondary | ICD-10-CM | POA: Insufficient documentation

## 2013-06-18 DIAGNOSIS — M1990 Unspecified osteoarthritis, unspecified site: Secondary | ICD-10-CM | POA: Insufficient documentation

## 2013-06-18 DIAGNOSIS — G4733 Obstructive sleep apnea (adult) (pediatric): Secondary | ICD-10-CM | POA: Insufficient documentation

## 2013-06-18 DIAGNOSIS — E785 Hyperlipidemia, unspecified: Secondary | ICD-10-CM | POA: Insufficient documentation

## 2013-06-22 ENCOUNTER — Encounter: Payer: Self-pay | Admitting: Interventional Cardiology

## 2013-06-22 ENCOUNTER — Ambulatory Visit (INDEPENDENT_AMBULATORY_CARE_PROVIDER_SITE_OTHER): Payer: Medicare Other | Admitting: Interventional Cardiology

## 2013-06-22 VITALS — BP 124/74 | HR 59 | Ht 71.0 in | Wt 327.0 lb

## 2013-06-22 DIAGNOSIS — E785 Hyperlipidemia, unspecified: Secondary | ICD-10-CM

## 2013-06-22 DIAGNOSIS — I10 Essential (primary) hypertension: Secondary | ICD-10-CM

## 2013-06-22 DIAGNOSIS — I25810 Atherosclerosis of coronary artery bypass graft(s) without angina pectoris: Secondary | ICD-10-CM

## 2013-06-22 DIAGNOSIS — I739 Peripheral vascular disease, unspecified: Secondary | ICD-10-CM | POA: Insufficient documentation

## 2013-06-22 DIAGNOSIS — I2510 Atherosclerotic heart disease of native coronary artery without angina pectoris: Secondary | ICD-10-CM

## 2013-06-22 NOTE — Progress Notes (Signed)
Patient ID: JADEN ABREU, male   DOB: 10-26-1933, 79 y.o.   MRN: [id number]    [address] [place], [place]  [address] Phone: [phone] Fax:  [phone]  Date:  06/22/2013   ID:  DACOTAH CABELLO, DOB 07-Jan-1934, MRN [id number]  PCP:  Lillia Mountain, MD   ASSESSMENT: 1. Coronary atherosclerosis, stable without recurrence of angina 2. Hypertension under good control 3. Marked obesity, poorly controlled 4. Hyperlipidemia on therapy and followed by his primary care physician  PLAN:  1. In absence of symptoms, no functional testing is indicated. The patient is relatively active and recently during your up and do a lot of walking without cardiovascular limitations. 2. He is cautioned to call if any chest discomfort or unexplained dyspnea 3. I will otherwise plan to see him back in one year. We discussed arias for lipid control with an LDL of less than 70. I encouraged reduced caloric intake and increase activity to decrease weight.   SUBJECTIVE: COLBIN JOVEL is a 79 y.o. male who is doing well. He has no cardiac complaints. He has not as active as he should be. He recently toured [place] and  did much walking while in [place]. There were no cardiopulmonary limitations. He denies orthopnea PND. There is no peripheral edema. No medication side effects he denies neurological complaints. No claudication to   Wt Readings from Last 3 Encounters:  06/22/13 327 lb (148.326 kg)     Past Medical History  Diagnosis Date  . Coronary atherosclerosis of unspecified type of vessel, native or graft   . Hyperlipidemia   . HTN (hypertension)   . Erectile dysfunction   . Other testicular hypofunction   . Depressed bipolar disorder   . OSA (obstructive sleep apnea)   . CAD (coronary artery disease)      PTCA and stent March 2003  . DJD (degenerative joint disease)     Dr. Luiz Blare    Current Outpatient Prescriptions  Medication Sig Dispense Refill  . amLODipine  (NORVASC) 5 MG tablet Take 5 mg by mouth daily.      Marland Kitchen aspirin 81 MG tablet Take 81 mg by mouth daily.      . Cholecalciferol (VITAMIN D PO) Take by mouth daily.      Marland Kitchen escitalopram (LEXAPRO) 20 MG tablet Take 20 mg by mouth daily.      Marland Kitchen NITROSTAT 0.4 MG SL tablet PLACE 1 TABLET UNDER THE TONGUE EVERY 5 MINUTES FOR CHEST PAIN,UP TO 3 TABLETS  25 tablet  2  . simvastatin (ZOCOR) 20 MG tablet TAKE ONE TABLET DAILY      . spironolactone (ALDACTONE) 25 MG tablet Take 25 mg by mouth daily.       No current facility-administered medications for this visit.    Allergies:   No Known Allergies  Social History:    History   Social History  . Marital Status: Married    Spouse Name: N/A    Number of Children: N/A  . Years of Education: N/A   Occupational History  . Not on file.   Social History Main Topics  . Smoking status: Former Games developer  . Smokeless tobacco: Not on file     Comment: QUIT 63YRS AGO  . Alcohol Use: Yes     Comment: EVERY DAY WINE  . Drug Use: No  . Sexual Activity: Not on file   Other Topics Concern  . Not on file   Social History Narrative  .  No narrative on file   ROS:  Please see the history of present illness.   Denies syncope, prolonged palpitations, and transient neurological symptoms.   All other systems reviewed and negative.   OBJECTIVE: VS:  BP 124/74  Pulse 59  Ht 5\' 11"  (1.803 m)  Wt 327 lb (148.326 kg)  BMI 45.63 kg/m2  SpO2 96% Well nourished, well developed, in no acute distress, marked obesity HEENT: normal Neck: JVD absent. Carotid bruit absent  Cardiac:  normal S1, S2; RRR; no murmur Lungs:  clear to auscultation bilaterally, no wheezing, rhonchi or rales Abd: soft, nontender, no hepatomegaly Ext: Edema absent. Pulses bilateral 2+ Skin: warm and dry Neuro:  CNs 2-12 intact, no focal abnormalities noted  EKG:  Normal sinus rhythm with right bundle branch block and left anterior hemiblock. No change when compared to January 2014 tracing        Signed, Darci Needle III, MD 06/22/2013 2:52 PM

## 2013-06-22 NOTE — Patient Instructions (Signed)
Your physician recommends that you continue on your current medications as directed. Please refer to the Current Medication list given to you today.  Your physician wants you to follow-up in: 1 year You will receive a reminder letter in the mail two months in advance. If you don't receive a letter, please call our office to schedule the follow-up appointment.  Try to maintain an active lifestyle

## 2013-11-02 ENCOUNTER — Other Ambulatory Visit: Payer: Self-pay | Admitting: Internal Medicine

## 2013-11-02 ENCOUNTER — Ambulatory Visit
Admission: RE | Admit: 2013-11-02 | Discharge: 2013-11-02 | Disposition: A | Discharge status: Home / Self Care | Payer: Commercial Managed Care - HMO | Source: Ambulatory Visit | Attending: Internal Medicine | Admitting: Internal Medicine

## 2013-11-02 DIAGNOSIS — M25579 Pain in unspecified ankle and joints of unspecified foot: Secondary | ICD-10-CM

## 2014-01-16 ENCOUNTER — Encounter: Payer: Self-pay | Admitting: Dietician

## 2014-01-16 ENCOUNTER — Encounter: Payer: Medicare PPO | Attending: Internal Medicine | Admitting: Dietician

## 2014-01-16 VITALS — Ht 71.0 in | Wt 304.2 lb

## 2014-01-16 DIAGNOSIS — Z713 Dietary counseling and surveillance: Secondary | ICD-10-CM | POA: Insufficient documentation

## 2014-01-16 DIAGNOSIS — E1165 Type 2 diabetes mellitus with hyperglycemia: Secondary | ICD-10-CM | POA: Diagnosis present

## 2014-01-16 DIAGNOSIS — IMO0002 Reserved for concepts with insufficient information to code with codable children: Secondary | ICD-10-CM

## 2014-01-16 DIAGNOSIS — E118 Type 2 diabetes mellitus with unspecified complications: Principal | ICD-10-CM

## 2014-01-16 NOTE — Patient Instructions (Signed)
-  Physical activity: 15 minutes 2 to 3 days a week  -Fill up on non-starchy vegetables (any veggie except corn, peas, or potatoes)  -Raw or cooked, fresh or frozen!  -225 grams of carbs per day - 15 total choices -3-4 servings at meals and 1 at snacks -1 serving = 15 grams   -Limit saturated fats (high-fat cheese, high-fat dairy, fatty cuts of meat)  -Continue to eat plenty of fiber  -Limit wine to 10 oz per day

## 2014-01-16 NOTE — Progress Notes (Signed)
  Medical Nutrition Therapy:  Appt start time: 1600 end time:  1730.   Assessment:  Primary concerns today: Charles Bonilla is here today with his wife. He was diagnosed with diabetes in April with a HgbA1c of 13%. His wife reports that she has always had an interest in nutrition and she is very involved in the appointment today. Since diagnosis, Charles Bonilla has started taking Metformin and 12 units of insulin at night. He has been checking blood sugars in the morning before breakfast; they are averaging 98-140 mg/dL.     Preferred Learning Style:   No preference indicated   Learning Readiness:   Contemplating   MEDICATIONS: see list, metformin and insulin   DIETARY INTAKE:  Charles Bonilla and his wife report enjoying lots of bread, cheese, and wine.  Usual physical activity: none  Estimated energy needs: 2000 calories 225 g carbohydrates 150 g protein 56 g fat  Progress Towards Goal(s):  Some progress.   Nutritional Diagnosis:  [place]-2.2 Altered nutrition-related laboratory As related to obesity and diagnosis of type 2 diabetes.  As evidenced by HgbA1c 13%.    Intervention:  Nutrition education provided. Explained HgbA1c test. Discussed the importance of exercise. Practiced carbohydrate counting.  Teaching Method Utilized: Visual Auditory Hands on  Handouts given during visit include:  Living well with diabetes booklet  MyPlate  CHO servings (yellow card)  15g CHO + protein snacks  Barriers to learning/adherence to lifestyle change: none  Demonstrated degree of understanding via:  Teach Back   Monitoring/Evaluation:  Dietary intake, exercise, and body weight in 6 week(s).

## 2014-02-28 ENCOUNTER — Encounter: Payer: Medicare PPO | Attending: Internal Medicine | Admitting: Dietician

## 2014-02-28 VITALS — Ht 71.0 in | Wt 300.0 lb

## 2014-02-28 DIAGNOSIS — E119 Type 2 diabetes mellitus without complications: Secondary | ICD-10-CM

## 2014-02-28 DIAGNOSIS — Z713 Dietary counseling and surveillance: Secondary | ICD-10-CM | POA: Insufficient documentation

## 2014-02-28 DIAGNOSIS — IMO0002 Reserved for concepts with insufficient information to code with codable children: Secondary | ICD-10-CM | POA: Diagnosis present

## 2014-02-28 DIAGNOSIS — E118 Type 2 diabetes mellitus with unspecified complications: Principal | ICD-10-CM

## 2014-02-28 DIAGNOSIS — E1165 Type 2 diabetes mellitus with hyperglycemia: Secondary | ICD-10-CM | POA: Diagnosis present

## 2014-02-28 NOTE — Patient Instructions (Addendum)
-  Physical activity: 15 minutes 2 to 3 days a week  -Consider getting a pedometer and setting a step goal  -Fill up on non-starchy vegetables (any veggie except corn, peas, or potatoes)  -Raw or cooked, fresh or frozen!  -Start having 2 slices of cheese bread instead of 4  -225 grams of carbs per day - 15 total choices -3-4 servings at meals and 1 at snacks -1 serving = 15 grams   -Limit saturated fats (high-fat cheese, high-fat dairy, fatty cuts of meat)  -Continue to eat plenty of fiber  -Limit wine to 12 oz per day

## 2014-02-28 NOTE — Progress Notes (Signed)
  Medical Nutrition Therapy:  Appt start time: 1600 end time: 1700   Follow up:  Charles Bonilla returns today with his wife. Per patient report, his most recent HgbA1c was 7% from 13% at diagnosis. He reports that he is now eating 4 pieces of bread instead of 2 with cheese for breakfast. Blood sugar this morning was 96. Now taking 9 units of insulin. His doctor told him to take 1 less unit of insulin per week. Walking 2-3 days a week [facility]   Preferred Learning Style:   No preference indicated   Learning Readiness:   Contemplating   MEDICATIONS: see list, metformin and insulin   DIETARY INTAKE:  Charles Bonilla and his wife report enjoying lots of bread, cheese, and wine.  Usual physical activity: none  Estimated energy needs: 2000 calories 225 g carbohydrates 150 g protein 56 g fat  Progress Towards Goal(s):  Some progress.   Nutritional Diagnosis:  [place]-2.2 Altered nutrition-related laboratory As related to obesity and diagnosis of type 2 diabetes.  As evidenced by HgbA1c 7%.    Intervention:  Nutrition education provided. Explained HgbA1c test. Discussed the importance of exercise. Practiced carbohydrate counting.  Teaching Method Utilized: Visual Auditory Hands on   Barriers to learning/adherence to lifestyle change: none  Demonstrated degree of understanding via:  Teach Back   Monitoring/Evaluation:  Dietary intake, exercise, and body weight in 3 months.

## 2014-06-06 ENCOUNTER — Ambulatory Visit: Payer: Commercial Managed Care - HMO | Admitting: Dietician

## 2014-06-22 ENCOUNTER — Encounter: Payer: Self-pay | Admitting: Interventional Cardiology

## 2014-06-22 ENCOUNTER — Ambulatory Visit (INDEPENDENT_AMBULATORY_CARE_PROVIDER_SITE_OTHER): Payer: Commercial Managed Care - HMO | Admitting: Interventional Cardiology

## 2014-06-22 VITALS — BP 130/80 | HR 57 | Ht 71.0 in | Wt 312.0 lb

## 2014-06-22 DIAGNOSIS — E785 Hyperlipidemia, unspecified: Secondary | ICD-10-CM

## 2014-06-22 DIAGNOSIS — G4733 Obstructive sleep apnea (adult) (pediatric): Secondary | ICD-10-CM

## 2014-06-22 DIAGNOSIS — I25810 Atherosclerosis of coronary artery bypass graft(s) without angina pectoris: Secondary | ICD-10-CM

## 2014-06-22 DIAGNOSIS — I452 Bifascicular block: Secondary | ICD-10-CM

## 2014-06-22 DIAGNOSIS — I2510 Atherosclerotic heart disease of native coronary artery without angina pectoris: Secondary | ICD-10-CM

## 2014-06-22 DIAGNOSIS — I10 Essential (primary) hypertension: Secondary | ICD-10-CM

## 2014-06-22 NOTE — Patient Instructions (Signed)
Your physician recommends that you continue on your current medications as directed. Please refer to the Current Medication list given to you today.  Your physician discussed the importance of regular exercise and recommended that you start or continue a regular exercise program for good health.   Your physician wants you to follow-up in: 1 year You will receive a reminder letter in the mail two months in advance. If you don't receive a letter, please call our office to schedule the follow-up appointment.  

## 2014-06-22 NOTE — Progress Notes (Signed)
Patient ID: Charles Bonilla, male   DOB: 1934-06-23, 80 y.o.   MRN: [id number]    [address] [place], [place]  [address] Phone: [phone] Fax:  [phone]  Date:  06/22/2014   ID:  Charles Downeredro J Twist, DOB 1934-06-23, MRN [id number]  PCP:  Lillia MountainGRIFFIN,JOHN JOSEPH, MD   ASSESSMENT:  1. Coronary artery disease with prior stent 2003, asymptomatic 2. Morbid obesity 3. Diabetes mellitus with vascular complications, currently being treated by Dr. Valentina LucksGriffin 4. Hypertension, controlled, essential 5. Obstructive sleep apnea  PLAN:  1. Aerobic activity 2. No cardiovascular testing necessary 3. If any chest burning, excessive dyspnea, or edema, he should give us a call 4. Clinical follow-up in one year   SUBJECTIVE: Charles Downeredro J Winstanley is a 80 y.o. male who is doing well. Recently diagnosed with diabetes. Gaining weight. He denies chest discomfort. This chronic but stable dyspnea. No lower extremity swelling. He has had no transient neurological complaints.   Wt Readings from Last 3 Encounters:  06/22/14 312 lb (141.522 kg)  02/28/14 300 lb (136.079 kg)  01/16/14 304 lb 3.2 oz (137.984 kg)     Past Medical History  Diagnosis Date  . Coronary atherosclerosis of unspecified type of vessel, native or graft   . Hyperlipidemia   . HTN (hypertension)   . Erectile dysfunction   . Other testicular hypofunction   . Depressed bipolar disorder   . OSA (obstructive sleep apnea)   . CAD (coronary artery disease)      PTCA and stent March 2003  . DJD (degenerative joint disease)     Dr. Luiz BlareGraves    Current Outpatient Prescriptions  Medication Sig Dispense Refill  . amLODipine (NORVASC) 5 MG tablet Take 5 mg by mouth daily.    Marland Kitchen. aspirin 81 MG tablet Take 81 mg by mouth daily.    . Cholecalciferol (VITAMIN D PO) Take by mouth daily. daily    . escitalopram (LEXAPRO) 20 MG tablet Take 20 mg by mouth daily.    Marland Kitchen. LEVEMIR FLEXTOUCH 100 UNIT/ML Pen As directed    . metFORMIN  (GLUCOPHAGE) 500 MG tablet 1 tab twice a day    . NITROSTAT 0.4 MG SL tablet PLACE 1 TABLET UNDER THE TONGUE EVERY 5 MINUTES FOR CHEST PAIN,UP TO 3 TABLETS 25 tablet 2  . simvastatin (ZOCOR) 20 MG tablet TAKE ONE TABLET DAILY    . spironolactone (ALDACTONE) 25 MG tablet Take 25 mg by mouth daily.     No current facility-administered medications for this visit.    Allergies:   No Known Allergies  Social History:  The patient  reports that he has quit smoking. He does not have any smokeless tobacco history on file. He reports that he drinks alcohol. He reports that he does not use illicit drugs.   ROS:  Please see the history of present illness.   Denies claudication. Denies orthopnea. Denies palpitations and syncope. No nausea, vomiting, hematemesis, or hematochezia.   All other systems reviewed and negative.   OBJECTIVE: VS:  BP 130/80 mmHg  Pulse 57  Ht 5\' 11"  (1.803 m)  Wt 312 lb (141.522 kg)  BMI 43.53 kg/m2 Well nourished, well developed, in no acute distress, morbid obesity HEENT: normal Neck: JVD flat. Carotid bruit absent  Cardiac:  normal S1, S2; RRR; no murmur Lungs:  clear to auscultation bilaterally, no wheezing, rhonchi or rales Abd: soft, nontender, no hepatomegaly Ext: Edema absent. Pulses 2+ Skin: warm and dry Neuro:  CNs 2-12 intact,  no focal abnormalities noted  EKG: Right bundle branch block, left anterior hemiblock, and sinus bradycardia       Signed, Darci NeedleHenry W. B. Smith III, MD 06/22/2014 2:33 PM

## 2014-06-22 NOTE — Addendum Note (Signed)
Addended by: Verdis PrimeSMITH, Yuriel Lopezmartinez on: 06/22/2014 05:19 PM   Modules accepted: Level of Service

## 2014-07-18 ENCOUNTER — Ambulatory Visit: Payer: Commercial Managed Care - HMO | Admitting: Dietician

## 2014-08-16 ENCOUNTER — Encounter: Payer: Commercial Managed Care - HMO | Attending: Internal Medicine | Admitting: Dietician

## 2014-08-16 VITALS — Wt 315.0 lb

## 2014-08-16 DIAGNOSIS — E119 Type 2 diabetes mellitus without complications: Secondary | ICD-10-CM | POA: Insufficient documentation

## 2014-08-16 DIAGNOSIS — Z713 Dietary counseling and surveillance: Secondary | ICD-10-CM | POA: Diagnosis not present

## 2014-08-16 NOTE — Patient Instructions (Addendum)
-  Have something to eat every 3-5 hours that you are awake -Keep a snack with carbs+protein   If you have a low blood sugar (<70), have 15 grams of carbohydrate (sugar), wait 15 minutes and test again  Keep exercising!

## 2014-08-16 NOTE — Progress Notes (Signed)
  Medical Nutrition Therapy:  Appt start time: 1205 end time:  1235  Follow up:  Charles Bonilla returns today with his wife having gained a few pounds during the holidays. HgbA1c has dropped to 5.9%. Blood sugars running between 107-125 mg/dL. [facility] (minimum 15 minutes on treadmill or stationary bike). Having 1-2 large slices of bread with cheese and tea for breakfast. Taking 6 units of insulin. Not having any issues with low blood sugars.  Wt Readings from Last 3 Encounters:  08/16/14 315 lb (142.883 kg)  06/22/14 312 lb (141.522 kg)  02/28/14 300 lb (136.079 kg)   Ht Readings from Last 3 Encounters:  06/22/14 5\' 11"  (1.803 m)  02/28/14 5\' 11"  (1.803 m)  01/16/14 5\' 11"  (1.803 m)   Body mass index is 43.95 kg/(m^2). @BMIFA @ Normalized weight-for-age data available only for age 62 to 20 years. Normalized stature-for-age data available only for age 62 to 20 years.   Preferred Learning Style:   No preference indicated   Learning Readiness:   Contemplating   MEDICATIONS: see list, metformin and insulin   DIETARY INTAKE:  Charles Bonilla and his wife report enjoying lots of bread, cheese, and wine.  Usual physical activity: none  Estimated energy needs: 2000 calories 225 g carbohydrates 150 g protein 56 g fat  Progress Towards Goal(s):  Some progress.   Nutritional Diagnosis:  [place] [facility]-2.2 Altered nutrition-related laboratory As related to obesity and diagnosis of type 2 diabetes.  As evidenced by HgbA1c 7%.    Intervention:  Nutrition education provided. Explained HgbA1c test. Discussed the importance of exercise. Practiced carbohydrate counting.  Teaching Method Utilized: Visual Auditory Hands on  Barriers to learning/adherence to lifestyle change: none  Demonstrated degree of understanding via:  Teach Back   Monitoring/Evaluation:  Dietary intake, exercise, and body weight in 3 months.

## 2014-09-28 ENCOUNTER — Ambulatory Visit (INDEPENDENT_AMBULATORY_CARE_PROVIDER_SITE_OTHER): Payer: Commercial Managed Care - HMO | Admitting: Podiatry

## 2014-09-28 ENCOUNTER — Ambulatory Visit (INDEPENDENT_AMBULATORY_CARE_PROVIDER_SITE_OTHER): Payer: Commercial Managed Care - HMO

## 2014-09-28 ENCOUNTER — Encounter: Payer: Self-pay | Admitting: Podiatry

## 2014-09-28 VITALS — BP 173/67 | HR 64 | Resp 16

## 2014-09-28 DIAGNOSIS — M722 Plantar fascial fibromatosis: Secondary | ICD-10-CM

## 2014-09-28 DIAGNOSIS — M779 Enthesopathy, unspecified: Secondary | ICD-10-CM | POA: Diagnosis not present

## 2014-09-28 DIAGNOSIS — L600 Ingrowing nail: Secondary | ICD-10-CM

## 2014-09-28 NOTE — Patient Instructions (Signed)
Diabetes and Foot Care Diabetes may cause you to have problems because of poor blood supply (circulation) to your feet and legs. This may cause the skin on your feet to become thinner, break easier, and heal more slowly. Your skin may become dry, and the skin may peel and crack. You may also have nerve damage in your legs and feet causing decreased feeling in them. You may not notice minor injuries to your feet that could lead to infections or more serious problems. Taking care of your feet is one of the most important things you can do for yourself.  HOME CARE INSTRUCTIONS  Wear shoes at all times, even in the house. Do not go barefoot. Bare feet are easily injured.  Check your feet daily for blisters, cuts, and redness. If you cannot see the bottom of your feet, use a mirror or ask someone for help.  Wash your feet with warm water (do not use hot water) and mild soap. Then pat your feet and the areas between your toes until they are completely dry. Do not soak your feet as this can dry your skin.  Apply a moisturizing lotion or petroleum jelly (that does not contain alcohol and is unscented) to the skin on your feet and to dry, brittle toenails. Do not apply lotion between your toes.  Trim your toenails straight across. Do not dig under them or around the cuticle. File the edges of your nails with an emery board or nail file.  Do not cut corns or calluses or try to remove them with medicine.  Wear clean socks or stockings every day. Make sure they are not too tight. Do not wear knee-high stockings since they may decrease blood flow to your legs.  Wear shoes that fit properly and have enough cushioning. To break in new shoes, wear them for just a few hours a day. This prevents you from injuring your feet. Always look in your shoes before you put them on to be sure there are no objects inside.  Do not cross your legs. This may decrease the blood flow to your feet.  If you find a minor scrape,  cut, or break in the skin on your feet, keep it and the skin around it clean and dry. These areas may be cleansed with mild soap and water. Do not cleanse the area with peroxide, alcohol, or iodine.  When you remove an adhesive bandage, be sure not to damage the skin around it.  If you have a wound, look at it several times a day to make sure it is healing.  Do not use heating pads or hot water bottles. They may burn your skin. If you have lost feeling in your feet or legs, you may not know it is happening until it is too late.  Make sure your health care provider performs a complete foot exam at least annually or more often if you have foot problems. Report any cuts, sores, or bruises to your health care provider immediately. SEEK MEDICAL CARE IF:   You have an injury that is not healing.  You have cuts or breaks in the skin.  You have an ingrown nail.  You notice redness on your legs or feet.  You feel burning or tingling in your legs or feet.  You have pain or cramps in your legs and feet.  Your legs or feet are numb.  Your feet always feel cold. SEEK IMMEDIATE MEDICAL CARE IF:   There is increasing redness,   swelling, or pain in or around a wound.  There is a red line that goes up your leg.  Pus is coming from a wound.  You develop a fever or as directed by your health care provider.  You notice a bad smell coming from an ulcer or wound. Document Released: 07/25/2000 Document Revised: 03/30/2013 Document Reviewed: 01/04/2013 ExitCare Patient Information 2015 [employer]. This information is not intended to replace advice given to you by your health care provider. Make sure you discuss any questions you have with your health care provider.  

## 2014-09-28 NOTE — Progress Notes (Signed)
   Subjective:    Patient ID: Charles Bonilla, male    DOB: Nov 22, 1933, 81 y.o.   MRN: [id number]  HPI Comments: "My feet hurt on the bottom"  Patient c/o tenderness plantar bilateral for several months. He is diabetic. Also, concerned with the 4th toe right. Sits under the 3rd toe.     Review of Systems  All other systems reviewed and are negative.      Objective:   Physical Exam        Assessment & Plan:

## 2014-09-28 NOTE — Progress Notes (Signed)
Subjective:     Patient ID: Charles Bonilla, male   DOB: 1934/05/18, 81 y.o.   MRN: [id number]  HPI patient states I have pain in my feet and pain specifically in my ingrown toenails of my big toes of both feet that I cannot cut out myself or reach. States that they make it hard for him to wear shoe gear   Review of Systems  All other systems reviewed and are negative.      Objective:   Physical Exam  Constitutional: He is oriented to person, place, and time.  Cardiovascular: Intact distal pulses.   Musculoskeletal: Normal range of motion.  Neurological: He is oriented to person, place, and time.  Skin: Skin is warm.  Nursing note and vitals reviewed.  neurovascular status intact with mild diabetes is under excellent control with sugars running around 100 and noted to have good muscle strength and diminished range of motion of the subtalar joint bilateral. Patient is noted to have good digital perfusion is well oriented 3 and I noted incurvated hallux nail borders medial border of the hallux bilateral. 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 I explained to him the risk of procedure and today I infiltrated 60 mg Xylocaine Marcaine mixture and remove the medial border exposing the matrix and applying phenol 3 applications followed by alcohol lavaged and sterile dressing. Gave instructions on soaks and wearing

## 2014-10-12 ENCOUNTER — Ambulatory Visit: Payer: Commercial Managed Care - HMO | Admitting: Podiatry

## 2014-10-23 DIAGNOSIS — H524 Presbyopia: Secondary | ICD-10-CM | POA: Diagnosis not present

## 2014-10-23 DIAGNOSIS — H5203 Hypermetropia, bilateral: Secondary | ICD-10-CM | POA: Diagnosis not present

## 2014-10-23 DIAGNOSIS — E119 Type 2 diabetes mellitus without complications: Secondary | ICD-10-CM | POA: Diagnosis not present

## 2014-10-23 DIAGNOSIS — Z961 Presence of intraocular lens: Secondary | ICD-10-CM | POA: Diagnosis not present

## 2014-11-13 ENCOUNTER — Encounter: Payer: Commercial Managed Care - HMO | Attending: Internal Medicine | Admitting: Dietician

## 2014-11-13 DIAGNOSIS — Z713 Dietary counseling and surveillance: Secondary | ICD-10-CM | POA: Insufficient documentation

## 2014-11-13 DIAGNOSIS — E119 Type 2 diabetes mellitus without complications: Secondary | ICD-10-CM | POA: Insufficient documentation

## 2014-11-13 NOTE — Progress Notes (Signed)
  Medical Nutrition Therapy:  Appt start time: 300 end time:  330  Follow up:  Charles Bonilla returns alone today. He declined weight today but states he weighed 325 lbs on his home scale this morning. He states he does not have an updated HgbA1c. Has not been exercising recently due to ingrown toenail. Back up to 10 units of insulin. Charles Bonilla reports blood sugars of 107-135 mg/dL in the mornings, fasting. No low blood sugars reported but states he sometimes feels "weak" if he doesn't eat throughout the day.   Wt Readings from Last 3 Encounters:  08/16/14 315 lb (142.883 kg)  06/22/14 312 lb (141.522 kg)  02/28/14 300 lb (136.079 kg)   Ht Readings from Last 3 Encounters:  06/22/14 5\' 11"  (1.803 m)  02/28/14 5\' 11"  (1.803 m)  01/16/14 5\' 11"  (1.803 m)   There is no weight on file to calculate BMI. @BMIFA @ Normalized weight-for-age data available only for age 18 to 20 years. Normalized stature-for-age data available only for age 18 to 20 years.   Preferred Learning Style:   No preference indicated   Learning Readiness:   Contemplating   MEDICATIONS: see list, metformin and insulin   DIETARY INTAKE:  Wakes up between 9:30am-12pm.   B: 2-3 thickly sliced pieces of whole wheat bread with cheese with unsweetened hot tea  May go all day without eating until dinnertime. "My wife forces me to eat if she is home." L: Leftovers or veggie and cheese omelet  D: salmon or tilapia with vegetables, sometimes rice or sweet potatoes with wine  S: fruit (berries, peaches, or pineapple) with ricotta   Usual physical activity: none  Estimated energy needs: 2000 calories 225 g carbohydrates 150 g protein 56 g fat  Progress Towards Goal(s):  Some progress.   Nutritional Diagnosis:  [place]-2.2 Altered nutrition-related laboratory As related to obesity and diagnosis of type 2 diabetes.  As evidenced by HgbA1c 7%.    Intervention:  Nutrition education provided.   Teaching Method  Utilized: Visual Auditory Hands on  Barriers to learning/adherence to lifestyle change: none  Demonstrated degree of understanding via:  Teach Back   Monitoring/Evaluation:  Dietary intake, exercise, and body weight prn.

## 2014-11-13 NOTE — Patient Instructions (Addendum)
-  Get back into exercise routine when you can  -Breakfast: Try 1 slice of cheese toast with 1/2 banana and a boiled egg  -Keep working on eating regularly  -Try walnuts, fruit, and/or string cheese

## 2014-11-28 DIAGNOSIS — Z0000 Encounter for general adult medical examination without abnormal findings: Secondary | ICD-10-CM | POA: Diagnosis not present

## 2014-11-28 DIAGNOSIS — I2510 Atherosclerotic heart disease of native coronary artery without angina pectoris: Secondary | ICD-10-CM | POA: Diagnosis not present

## 2014-11-28 DIAGNOSIS — F325 Major depressive disorder, single episode, in full remission: Secondary | ICD-10-CM | POA: Diagnosis not present

## 2014-11-28 DIAGNOSIS — E119 Type 2 diabetes mellitus without complications: Secondary | ICD-10-CM | POA: Diagnosis not present

## 2014-11-28 DIAGNOSIS — E780 Pure hypercholesterolemia: Secondary | ICD-10-CM | POA: Diagnosis not present

## 2014-11-28 DIAGNOSIS — G4733 Obstructive sleep apnea (adult) (pediatric): Secondary | ICD-10-CM | POA: Diagnosis not present

## 2014-11-28 DIAGNOSIS — R1030 Lower abdominal pain, unspecified: Secondary | ICD-10-CM | POA: Diagnosis not present

## 2014-11-28 DIAGNOSIS — I10 Essential (primary) hypertension: Secondary | ICD-10-CM | POA: Diagnosis not present

## 2015-04-03 DIAGNOSIS — G4733 Obstructive sleep apnea (adult) (pediatric): Secondary | ICD-10-CM | POA: Diagnosis not present

## 2015-04-03 DIAGNOSIS — E119 Type 2 diabetes mellitus without complications: Secondary | ICD-10-CM | POA: Diagnosis not present

## 2015-04-03 DIAGNOSIS — Z23 Encounter for immunization: Secondary | ICD-10-CM | POA: Diagnosis not present

## 2015-04-03 DIAGNOSIS — I10 Essential (primary) hypertension: Secondary | ICD-10-CM | POA: Diagnosis not present

## 2015-04-03 DIAGNOSIS — Z794 Long term (current) use of insulin: Secondary | ICD-10-CM | POA: Diagnosis not present

## 2015-07-04 ENCOUNTER — Other Ambulatory Visit: Payer: Self-pay | Admitting: Interventional Cardiology

## 2015-08-14 DIAGNOSIS — E119 Type 2 diabetes mellitus without complications: Secondary | ICD-10-CM | POA: Diagnosis not present

## 2015-08-14 DIAGNOSIS — Z794 Long term (current) use of insulin: Secondary | ICD-10-CM | POA: Diagnosis not present

## 2015-08-14 DIAGNOSIS — I10 Essential (primary) hypertension: Secondary | ICD-10-CM | POA: Diagnosis not present

## 2015-10-15 ENCOUNTER — Encounter: Payer: Self-pay | Admitting: Interventional Cardiology

## 2015-10-15 ENCOUNTER — Ambulatory Visit (INDEPENDENT_AMBULATORY_CARE_PROVIDER_SITE_OTHER): Payer: Commercial Managed Care - HMO | Admitting: Interventional Cardiology

## 2015-10-15 VITALS — BP 118/70 | HR 62 | Ht 71.0 in | Wt 329.6 lb

## 2015-10-15 DIAGNOSIS — I10 Essential (primary) hypertension: Secondary | ICD-10-CM | POA: Diagnosis not present

## 2015-10-15 DIAGNOSIS — E785 Hyperlipidemia, unspecified: Secondary | ICD-10-CM | POA: Diagnosis not present

## 2015-10-15 DIAGNOSIS — I452 Bifascicular block: Secondary | ICD-10-CM

## 2015-10-15 DIAGNOSIS — G4733 Obstructive sleep apnea (adult) (pediatric): Secondary | ICD-10-CM

## 2015-10-15 DIAGNOSIS — I2510 Atherosclerotic heart disease of native coronary artery without angina pectoris: Secondary | ICD-10-CM | POA: Diagnosis not present

## 2015-10-15 NOTE — Patient Instructions (Addendum)
Medication Instructions:  Your physician recommends that you continue on your current medications as directed. Please refer to the Current Medication list given to you today.   Labwork: None ordered  Testing/Procedures: None ordered  Follow-Up: Your physician wants you to follow-up in: 1 year with Dr.Smith You will receive a reminder letter in the mail two months in advance. If you don't receive a letter, please call our office to schedule the follow-up appointment.   Any Other Special Instructions Will Be Listed Below (If Applicable). Your physician discussed the importance of regular exercise and recommended that you start or continue a regular exercise program for good health.       If you need a refill on your cardiac medications before your next appointment, please call your pharmacy.   

## 2015-10-15 NOTE — Progress Notes (Signed)
Cardiology Office Note   Date:  10/15/2015   ID:  Charles Bonilla, DOB [birth year], MRN [id number]  PCP:  Lillia MountainGRIFFIN,JOHN JOSEPH, MD  Cardiologist:  Lesleigh NoeSMITH III,Allard Lightsey W, MD   Chief Complaint  Patient presents with  . scheduled follow up    cad      History of Present Illness: Charles Bonilla is a 82 y.o. male who presents for Bare-metal stent 2003, hypertension, hyperlipidemia, and diabetes.  He has no cardio pulmonary complaints. I gather that he is relatively sedentary although it is difficult to gain that history. He has no orthopnea, PND, lower extremity swelling, palpitations, or syncope.    Past Medical History  Diagnosis Date  . Coronary atherosclerosis of unspecified type of vessel, native or graft   . Hyperlipidemia   . HTN (hypertension)   . Erectile dysfunction   . Other testicular hypofunction   . Depressed bipolar disorder (HCC)   . OSA (obstructive sleep apnea)   . CAD (coronary artery disease)      PTCA and stent March 2003  . DJD (degenerative joint disease)     Dr. Luiz BlareGraves    Past Surgical History  Procedure Laterality Date  . R knee replacement, rowan    . L knee replacement, rowan       Current Outpatient Prescriptions  Medication Sig Dispense Refill  . amLODipine (NORVASC) 5 MG tablet Take 5 mg by mouth daily.    Marland Kitchen. aspirin 81 MG tablet Take 81 mg by mouth daily.    . Cholecalciferol (VITAMIN D PO) Take 1 tablet by mouth daily. daily    . escitalopram (LEXAPRO) 20 MG tablet Take 20 mg by mouth daily.    Marland Kitchen. LEVEMIR FLEXTOUCH 100 UNIT/ML Pen Inject 10 Units into the skin daily. As directed    . metFORMIN (GLUCOPHAGE) 500 MG tablet Take 500 mg by mouth 2 (two) times daily with a meal. 1 tab twice a day    . NITROSTAT 0.4 MG SL tablet PLACE 1 TABLET UNDER THE TONGUE EVERY 5 MINUTES FOR CHEST PAIN,UP TO 3 TABLETS 25 tablet 0  . simvastatin (ZOCOR) 20 MG tablet Take 20 mg by mouth daily at 6 PM. TAKE ONE TABLET DAILY    . spironolactone (ALDACTONE) 25  MG tablet Take 25 mg by mouth daily.     No current facility-administered medications for this visit.    Allergies:   Review of patient's allergies indicates no known allergies.    Social History:  The patient  reports that he has quit smoking. He does not have any smokeless tobacco history on file. He reports that he drinks alcohol. He reports that he does not use illicit drugs.   Family History:  The patient's family history is not on file.    ROS:  Please see the history of present illness.   Otherwise, review of systems are positive for C Pap mask is worn at night causes of sleep apnea. Occasional lower extremity swelling. Sedentary lifestyle..   All other systems are reviewed and negative.    PHYSICAL EXAM: VS:  BP 118/70 mmHg  Pulse 62  Ht 5\' 11"  (1.803 m)  Wt 329 lb 9.6 oz (149.506 kg)  BMI 45.99 kg/m2 , BMI Body mass index is 45.99 kg/(m^2). GEN: Well nourished, well developed, in no acute distressPeriod morbid obesity HEENT: normal Neck: no JVD, carotid bruits, or masses Cardiac: RRR.  There is no murmur, rub, or gallop. There is no edema. Respiratory:  clear to auscultation  bilaterally, normal work of breathing. GI: soft, nontender, nondistended, + BS MS: no deformity or atrophy Skin: warm and dry, no rash Neuro:  Strength and sensation are intact Psych: euthymic mood, full affect   EKG:  EKG is ordered today. The ekg reveals right bundle branch block with left anterior hemiblock, normal sinus rhythm with normal PR interval.   Recent Labs: No results found for requested labs within last 365 days.    Lipid Panel No results found for: CHOL, TRIG, HDL, CHOLHDL, VLDL, LDLCALC, LDLDIRECT    Wt Readings from Last 3 Encounters:  10/15/15 329 lb 9.6 oz (149.506 kg)  08/16/14 315 lb (142.883 kg)  06/22/14 312 lb (141.522 kg)      Other studies Reviewed: Additional studies/ records that were reviewed today include: No electronic health record entries that are  revealing of cardiovascular data.. The findings include only entry is that related to plantar fasciitis evaluation in April 2016..    ASSESSMENT AND PLAN:  1. Coronary artery disease involving native coronary artery of native heart without angina pectoris Asymptomatic following bare-metal stent in 2003.   2. Essential hypertension Very well controlled  3. OSA (obstructive sleep apnea) Uses C Pap daily  4. Hyperlipidemia Followed by primary care  5. Right bundle branch block (RBBB) with left anterior hemiblock Unchanged    Current medicines are reviewed at length with the patient today.  The patient has the following concerns regarding medicines: None.  The following changes/actions have been instituted:    Needs to increase aerobic activity to help control weight  Labs/ tests ordered today include:  No orders of the defined types were placed in this encounter.     Disposition:   FU with HS in 1 year  Signed, Lesleigh Noe, MD  10/15/2015 3:48 PM    [facility] [address] [place], [place], [place]  [address] Phone: [phone]; Fax: [phone]

## 2015-10-16 NOTE — Addendum Note (Signed)
Addended by: Reesa ChewJONES, Shamiyah Ngu G on: 10/16/2015 05:28 PM   Modules accepted: Orders

## 2015-10-29 DIAGNOSIS — H5203 Hypermetropia, bilateral: Secondary | ICD-10-CM | POA: Diagnosis not present

## 2015-10-29 DIAGNOSIS — E119 Type 2 diabetes mellitus without complications: Secondary | ICD-10-CM | POA: Diagnosis not present

## 2015-12-17 DIAGNOSIS — E119 Type 2 diabetes mellitus without complications: Secondary | ICD-10-CM | POA: Diagnosis not present

## 2015-12-17 DIAGNOSIS — I10 Essential (primary) hypertension: Secondary | ICD-10-CM | POA: Diagnosis not present

## 2015-12-17 DIAGNOSIS — Z0000 Encounter for general adult medical examination without abnormal findings: Secondary | ICD-10-CM | POA: Diagnosis not present

## 2015-12-17 DIAGNOSIS — Z6841 Body Mass Index (BMI) 40.0 and over, adult: Secondary | ICD-10-CM | POA: Diagnosis not present

## 2015-12-17 DIAGNOSIS — Z7984 Long term (current) use of oral hypoglycemic drugs: Secondary | ICD-10-CM | POA: Diagnosis not present

## 2015-12-17 DIAGNOSIS — E7800 Pure hypercholesterolemia, unspecified: Secondary | ICD-10-CM | POA: Diagnosis not present

## 2015-12-17 DIAGNOSIS — Z1389 Encounter for screening for other disorder: Secondary | ICD-10-CM | POA: Diagnosis not present

## 2016-04-21 DIAGNOSIS — Z23 Encounter for immunization: Secondary | ICD-10-CM | POA: Diagnosis not present

## 2016-04-21 DIAGNOSIS — I10 Essential (primary) hypertension: Secondary | ICD-10-CM | POA: Diagnosis not present

## 2016-04-21 DIAGNOSIS — Z7984 Long term (current) use of oral hypoglycemic drugs: Secondary | ICD-10-CM | POA: Diagnosis not present

## 2016-04-21 DIAGNOSIS — E119 Type 2 diabetes mellitus without complications: Secondary | ICD-10-CM | POA: Diagnosis not present

## 2016-08-22 DIAGNOSIS — E119 Type 2 diabetes mellitus without complications: Secondary | ICD-10-CM | POA: Diagnosis not present

## 2016-08-22 DIAGNOSIS — I10 Essential (primary) hypertension: Secondary | ICD-10-CM | POA: Diagnosis not present

## 2016-08-22 DIAGNOSIS — Z794 Long term (current) use of insulin: Secondary | ICD-10-CM | POA: Diagnosis not present

## 2016-10-07 DIAGNOSIS — H26491 Other secondary cataract, right eye: Secondary | ICD-10-CM | POA: Diagnosis not present

## 2016-10-07 DIAGNOSIS — E119 Type 2 diabetes mellitus without complications: Secondary | ICD-10-CM | POA: Diagnosis not present

## 2016-10-07 DIAGNOSIS — H35371 Puckering of macula, right eye: Secondary | ICD-10-CM | POA: Diagnosis not present

## 2016-10-07 DIAGNOSIS — H5203 Hypermetropia, bilateral: Secondary | ICD-10-CM | POA: Diagnosis not present

## 2016-11-18 ENCOUNTER — Encounter: Payer: Self-pay | Admitting: Interventional Cardiology

## 2016-11-20 DIAGNOSIS — H26491 Other secondary cataract, right eye: Secondary | ICD-10-CM | POA: Diagnosis not present

## 2016-12-03 NOTE — Progress Notes (Signed)
Cardiology Office Note    Date:  12/04/2016   ID:  Charles Bonilla, Charles Bonilla 03-11-1934, MRN [id number]  PCP:  Lillia Mountain, MD  Cardiologist: Lesleigh Noe, MD   Chief Complaint  Patient presents with  . Coronary Artery Disease    History of Present Illness:  Charles Bonilla is a 83 y.o. male who presents for Bare-metal stent 2003, hypertension, hyperlipidemia, and diabetes.  Charles Bonilla is doing well. Not quite as active as he used to be. He denies chest discomfort orthopnea and PND. There is trace lower extremity edema. No significant palpitations.  Past Medical History:  Diagnosis Date  . CAD (coronary artery disease)     PTCA and stent March 2003  . Coronary atherosclerosis of unspecified type of vessel, native or graft   . Depressed bipolar disorder (HCC)   . DJD (degenerative joint disease)    Dr. Luiz Blare  . Erectile dysfunction   . HTN (hypertension)   . Hyperlipidemia   . OSA (obstructive sleep apnea)   . Other testicular hypofunction     Past Surgical History:  Procedure Laterality Date  . L knee replacement, [name]    . R knee replacement, [name]      Current Medications: Outpatient Medications Prior to Visit  Medication Sig Dispense Refill  . amLODipine (NORVASC) 5 MG tablet Take 5 mg by mouth daily.    . Cholecalciferol (VITAMIN D PO) Take 1 tablet by mouth daily. daily    . escitalopram (LEXAPRO) 20 MG tablet Take 20 mg by mouth daily.    Marland Kitchen LEVEMIR FLEXTOUCH 100 UNIT/ML Pen Inject 10 Units into the skin daily. As directed    . metFORMIN (GLUCOPHAGE) 500 MG tablet Take 500 mg by mouth 2 (two) times daily with a meal. 1 tab twice a day    . NITROSTAT 0.4 MG SL tablet PLACE 1 TABLET UNDER THE TONGUE EVERY 5 MINUTES FOR CHEST PAIN,UP TO 3 TABLETS 25 tablet 0  . simvastatin (ZOCOR) 20 MG tablet Take 20 mg by mouth daily at 6 PM. TAKE ONE TABLET DAILY    . spironolactone (ALDACTONE) 25 MG tablet Take 25 mg by mouth daily.    Marland Kitchen aspirin 81 MG tablet Take 81 mg  by mouth daily.     No facility-administered medications prior to visit.      Allergies:   Patient has no known allergies.   Social History   Social History  . Marital status: Married    Spouse name: N/A  . Number of children: N/A  . Years of education: N/A   Social History Main Topics  . Smoking status: Former Games developer  . Smokeless tobacco: Never Used     Comment: QUIT 46YRS AGO  . Alcohol use Yes     Comment: EVERY DAY WINE  . Drug use: No  . Sexual activity: Not Asked   Other Topics Concern  . None   Social History Narrative  . None     Family History:  The patient's family history is not on file.   ROS:   Please see the history of present illness.    Some stiffness in his joints and hips. This impacts his ambulation.  All other systems reviewed and are negative.   PHYSICAL EXAM:   VS:  BP 112/62 (BP Location: Left Arm)   Pulse 62   Ht  (1.803 m)   Wt (!) 323 lb 6.4 oz (146.7 kg)   BMI 45.11 kg/m  GEN: Well nourished, well developed, in no acute distress  HEENT: normal  Neck: no JVD, carotid bruits, or masses Cardiac: RRR; no murmurs, rubs, or gallops,no edema  Respiratory:  clear to auscultation bilaterally, normal work of breathing GI: soft, nontender, nondistended, + BS MS: no deformity or atrophy  Skin: warm and dry, no rash Neuro:  Alert and Oriented x 3, Strength and sensation are intact Psych: euthymic mood, full affect  Wt Readings from Last 3 Encounters:  12/04/16 (!) 323 lb 6.4 oz (146.7 kg)  10/15/15 (!) 329 lb 9.6 oz (149.5 kg)  08/16/14 (!) 315 lb (142.9 kg)      Studies/Labs Reviewed:   EKG:  EKG  No change in chronic 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 Sinus rhythm. PR interval is normal. No change compared to one year ago.  Recent Labs: No results found for requested labs within last 8760 hours.   Lipid Panel No results found for: CHOL, TRIG, HDL, CHOLHDL, VLDL, LDLCALC,  LDLDIRECT  Additional studies/ records that were reviewed today include:  No recent data    ASSESSMENT:    1. Coronary artery disease involving native coronary artery of native heart without angina pectoris   2. Essential hypertension   3. Right bundle branch block (RBBB) with left anterior hemiblock   4. Other hyperlipidemia      PLAN:  In order of problems listed above:  1. Javelle has not had much success weight control. He is not as active as he previously while his. He denies chest pain. No episodes of syncope. 2. Excellent control on the current medical regimen. Salt restriction was discussed. 3. Unchanged from prior tracing. No increase in PR interval. 4. Followed by Dr. Valentina Lucks with target LDL less than 70.  Decrease aspirin 81 mg per day. Increase physical activity. Be mindful of the efforts for rate control. Clinical follow-up in one year.    Medication Adjustments/Labs and Tests Ordered: Current medicines are reviewed at length with the patient today.  Concerns regarding medicines are outlined above.  Medication changes, Labs and Tests ordered today are listed in the Patient Instructions below. There are no Patient Instructions on file for this visit.   Signed, Lesleigh Noe, MD  12/04/2016 2:43 PM    [facility] [address] [place], [place], [place]  [address] Phone: [phone]; Fax: [phone]

## 2016-12-04 ENCOUNTER — Ambulatory Visit (INDEPENDENT_AMBULATORY_CARE_PROVIDER_SITE_OTHER): Payer: Medicare HMO | Admitting: Interventional Cardiology

## 2016-12-04 ENCOUNTER — Encounter: Payer: Self-pay | Admitting: Interventional Cardiology

## 2016-12-04 VITALS — BP 112/62 | HR 62 | Ht 71.0 in | Wt 323.4 lb

## 2016-12-04 DIAGNOSIS — I10 Essential (primary) hypertension: Secondary | ICD-10-CM

## 2016-12-04 DIAGNOSIS — I452 Bifascicular block: Secondary | ICD-10-CM

## 2016-12-04 DIAGNOSIS — E784 Other hyperlipidemia: Secondary | ICD-10-CM | POA: Diagnosis not present

## 2016-12-04 DIAGNOSIS — E7849 Other hyperlipidemia: Secondary | ICD-10-CM

## 2016-12-04 DIAGNOSIS — I2510 Atherosclerotic heart disease of native coronary artery without angina pectoris: Secondary | ICD-10-CM | POA: Diagnosis not present

## 2016-12-04 NOTE — Patient Instructions (Signed)

## 2016-12-16 DIAGNOSIS — Z1389 Encounter for screening for other disorder: Secondary | ICD-10-CM | POA: Diagnosis not present

## 2016-12-16 DIAGNOSIS — Z0000 Encounter for general adult medical examination without abnormal findings: Secondary | ICD-10-CM | POA: Diagnosis not present

## 2016-12-16 DIAGNOSIS — F325 Major depressive disorder, single episode, in full remission: Secondary | ICD-10-CM | POA: Diagnosis not present

## 2017-04-08 DIAGNOSIS — I10 Essential (primary) hypertension: Secondary | ICD-10-CM | POA: Diagnosis not present

## 2017-04-08 DIAGNOSIS — E119 Type 2 diabetes mellitus without complications: Secondary | ICD-10-CM | POA: Diagnosis not present

## 2017-04-08 DIAGNOSIS — E1165 Type 2 diabetes mellitus with hyperglycemia: Secondary | ICD-10-CM | POA: Diagnosis not present

## 2017-04-08 DIAGNOSIS — I5032 Chronic diastolic (congestive) heart failure: Secondary | ICD-10-CM | POA: Diagnosis not present

## 2017-04-08 DIAGNOSIS — Z6841 Body Mass Index (BMI) 40.0 and over, adult: Secondary | ICD-10-CM | POA: Diagnosis not present

## 2017-04-08 DIAGNOSIS — Z794 Long term (current) use of insulin: Secondary | ICD-10-CM | POA: Diagnosis not present

## 2017-04-08 DIAGNOSIS — Z7984 Long term (current) use of oral hypoglycemic drugs: Secondary | ICD-10-CM | POA: Diagnosis not present

## 2017-04-29 DIAGNOSIS — Z23 Encounter for immunization: Secondary | ICD-10-CM | POA: Diagnosis not present

## 2017-06-19 DIAGNOSIS — M79604 Pain in right leg: Secondary | ICD-10-CM | POA: Diagnosis not present

## 2017-08-14 DIAGNOSIS — E119 Type 2 diabetes mellitus without complications: Secondary | ICD-10-CM | POA: Diagnosis not present

## 2017-08-14 DIAGNOSIS — Z7984 Long term (current) use of oral hypoglycemic drugs: Secondary | ICD-10-CM | POA: Diagnosis not present

## 2017-08-14 DIAGNOSIS — I10 Essential (primary) hypertension: Secondary | ICD-10-CM | POA: Diagnosis not present

## 2017-09-07 DIAGNOSIS — Z794 Long term (current) use of insulin: Secondary | ICD-10-CM | POA: Diagnosis not present

## 2017-09-07 DIAGNOSIS — E1165 Type 2 diabetes mellitus with hyperglycemia: Secondary | ICD-10-CM | POA: Diagnosis not present

## 2017-09-07 DIAGNOSIS — Z7984 Long term (current) use of oral hypoglycemic drugs: Secondary | ICD-10-CM | POA: Diagnosis not present

## 2017-09-10 DIAGNOSIS — I10 Essential (primary) hypertension: Secondary | ICD-10-CM | POA: Diagnosis not present

## 2017-09-10 DIAGNOSIS — I2510 Atherosclerotic heart disease of native coronary artery without angina pectoris: Secondary | ICD-10-CM | POA: Diagnosis not present

## 2017-09-10 DIAGNOSIS — I5032 Chronic diastolic (congestive) heart failure: Secondary | ICD-10-CM | POA: Diagnosis not present

## 2017-09-10 DIAGNOSIS — E119 Type 2 diabetes mellitus without complications: Secondary | ICD-10-CM | POA: Diagnosis not present

## 2017-09-10 DIAGNOSIS — E1165 Type 2 diabetes mellitus with hyperglycemia: Secondary | ICD-10-CM | POA: Diagnosis not present

## 2017-09-10 DIAGNOSIS — F325 Major depressive disorder, single episode, in full remission: Secondary | ICD-10-CM | POA: Diagnosis not present

## 2017-09-10 DIAGNOSIS — M179 Osteoarthritis of knee, unspecified: Secondary | ICD-10-CM | POA: Diagnosis not present

## 2017-09-10 DIAGNOSIS — Z794 Long term (current) use of insulin: Secondary | ICD-10-CM | POA: Diagnosis not present

## 2017-09-14 DIAGNOSIS — E119 Type 2 diabetes mellitus without complications: Secondary | ICD-10-CM | POA: Diagnosis not present

## 2017-09-14 DIAGNOSIS — E7800 Pure hypercholesterolemia, unspecified: Secondary | ICD-10-CM | POA: Diagnosis not present

## 2017-09-14 DIAGNOSIS — I10 Essential (primary) hypertension: Secondary | ICD-10-CM | POA: Diagnosis not present

## 2017-09-14 DIAGNOSIS — E1165 Type 2 diabetes mellitus with hyperglycemia: Secondary | ICD-10-CM | POA: Diagnosis not present

## 2017-09-14 DIAGNOSIS — I5032 Chronic diastolic (congestive) heart failure: Secondary | ICD-10-CM | POA: Diagnosis not present

## 2017-09-14 DIAGNOSIS — Z7984 Long term (current) use of oral hypoglycemic drugs: Secondary | ICD-10-CM | POA: Diagnosis not present

## 2017-09-22 DIAGNOSIS — E119 Type 2 diabetes mellitus without complications: Secondary | ICD-10-CM | POA: Diagnosis not present

## 2017-09-22 DIAGNOSIS — I10 Essential (primary) hypertension: Secondary | ICD-10-CM | POA: Diagnosis not present

## 2017-09-22 DIAGNOSIS — E1165 Type 2 diabetes mellitus with hyperglycemia: Secondary | ICD-10-CM | POA: Diagnosis not present

## 2017-09-22 DIAGNOSIS — Z794 Long term (current) use of insulin: Secondary | ICD-10-CM | POA: Diagnosis not present

## 2017-09-22 DIAGNOSIS — F325 Major depressive disorder, single episode, in full remission: Secondary | ICD-10-CM | POA: Diagnosis not present

## 2017-09-22 DIAGNOSIS — I5032 Chronic diastolic (congestive) heart failure: Secondary | ICD-10-CM | POA: Diagnosis not present

## 2017-09-22 DIAGNOSIS — M179 Osteoarthritis of knee, unspecified: Secondary | ICD-10-CM | POA: Diagnosis not present

## 2017-09-22 DIAGNOSIS — I2510 Atherosclerotic heart disease of native coronary artery without angina pectoris: Secondary | ICD-10-CM | POA: Diagnosis not present

## 2017-12-11 DIAGNOSIS — I10 Essential (primary) hypertension: Secondary | ICD-10-CM | POA: Diagnosis not present

## 2017-12-11 DIAGNOSIS — G4733 Obstructive sleep apnea (adult) (pediatric): Secondary | ICD-10-CM | POA: Diagnosis not present

## 2017-12-11 DIAGNOSIS — E7800 Pure hypercholesterolemia, unspecified: Secondary | ICD-10-CM | POA: Diagnosis not present

## 2017-12-11 DIAGNOSIS — Z794 Long term (current) use of insulin: Secondary | ICD-10-CM | POA: Diagnosis not present

## 2017-12-11 DIAGNOSIS — Z6841 Body Mass Index (BMI) 40.0 and over, adult: Secondary | ICD-10-CM | POA: Diagnosis not present

## 2017-12-11 DIAGNOSIS — I5032 Chronic diastolic (congestive) heart failure: Secondary | ICD-10-CM | POA: Diagnosis not present

## 2017-12-11 DIAGNOSIS — K921 Melena: Secondary | ICD-10-CM | POA: Diagnosis not present

## 2017-12-11 DIAGNOSIS — R319 Hematuria, unspecified: Secondary | ICD-10-CM | POA: Diagnosis not present

## 2017-12-11 DIAGNOSIS — I110 Hypertensive heart disease with heart failure: Secondary | ICD-10-CM | POA: Diagnosis not present

## 2017-12-11 DIAGNOSIS — F325 Major depressive disorder, single episode, in full remission: Secondary | ICD-10-CM | POA: Diagnosis not present

## 2017-12-11 DIAGNOSIS — E1169 Type 2 diabetes mellitus with other specified complication: Secondary | ICD-10-CM | POA: Diagnosis not present

## 2017-12-11 DIAGNOSIS — I2510 Atherosclerotic heart disease of native coronary artery without angina pectoris: Secondary | ICD-10-CM | POA: Diagnosis not present

## 2017-12-11 DIAGNOSIS — Z0000 Encounter for general adult medical examination without abnormal findings: Secondary | ICD-10-CM | POA: Diagnosis not present

## 2017-12-11 DIAGNOSIS — R8290 Unspecified abnormal findings in urine: Secondary | ICD-10-CM | POA: Diagnosis not present

## 2017-12-11 DIAGNOSIS — Z1389 Encounter for screening for other disorder: Secondary | ICD-10-CM | POA: Diagnosis not present

## 2017-12-14 DIAGNOSIS — R319 Hematuria, unspecified: Secondary | ICD-10-CM | POA: Diagnosis not present

## 2017-12-14 DIAGNOSIS — R8290 Unspecified abnormal findings in urine: Secondary | ICD-10-CM | POA: Diagnosis not present

## 2017-12-16 DIAGNOSIS — G4733 Obstructive sleep apnea (adult) (pediatric): Secondary | ICD-10-CM | POA: Diagnosis not present

## 2017-12-16 DIAGNOSIS — I10 Essential (primary) hypertension: Secondary | ICD-10-CM | POA: Diagnosis not present

## 2017-12-16 DIAGNOSIS — R194 Change in bowel habit: Secondary | ICD-10-CM | POA: Diagnosis not present

## 2017-12-16 DIAGNOSIS — Z7984 Long term (current) use of oral hypoglycemic drugs: Secondary | ICD-10-CM | POA: Diagnosis not present

## 2017-12-16 DIAGNOSIS — E119 Type 2 diabetes mellitus without complications: Secondary | ICD-10-CM | POA: Diagnosis not present

## 2017-12-16 DIAGNOSIS — K625 Hemorrhage of anus and rectum: Secondary | ICD-10-CM | POA: Diagnosis not present

## 2017-12-23 DIAGNOSIS — K921 Melena: Secondary | ICD-10-CM | POA: Diagnosis not present

## 2018-01-06 ENCOUNTER — Other Ambulatory Visit: Payer: Self-pay | Admitting: Gastroenterology

## 2018-01-06 DIAGNOSIS — K921 Melena: Secondary | ICD-10-CM

## 2018-01-15 DIAGNOSIS — E119 Type 2 diabetes mellitus without complications: Secondary | ICD-10-CM | POA: Diagnosis not present

## 2018-01-15 DIAGNOSIS — H524 Presbyopia: Secondary | ICD-10-CM | POA: Diagnosis not present

## 2018-01-15 DIAGNOSIS — R3129 Other microscopic hematuria: Secondary | ICD-10-CM | POA: Diagnosis not present

## 2018-01-15 DIAGNOSIS — Z961 Presence of intraocular lens: Secondary | ICD-10-CM | POA: Diagnosis not present

## 2018-01-19 ENCOUNTER — Ambulatory Visit
Admission: RE | Admit: 2018-01-19 | Discharge: 2018-01-19 | Disposition: A | Discharge status: Home / Self Care | Payer: Medicare HMO | Source: Ambulatory Visit | Attending: Gastroenterology | Admitting: Gastroenterology

## 2018-01-19 ENCOUNTER — Other Ambulatory Visit: Payer: Self-pay | Admitting: Gastroenterology

## 2018-01-19 DIAGNOSIS — N200 Calculus of kidney: Secondary | ICD-10-CM | POA: Diagnosis not present

## 2018-01-19 DIAGNOSIS — K921 Melena: Secondary | ICD-10-CM

## 2018-01-24 NOTE — Progress Notes (Signed)
Cardiology Office Note    Date:  01/25/2018   ID:  Charles Bonilla, DOB November 09, 1933, MRN [id number]  PCP:  Kirby FunkGriffin, John, MD  Cardiologist: Lesleigh NoeHenry W Arnette Driggs III, MD   No chief complaint on file.   History of Present Illness:  Charles Bonilla is a 84 y.o. male  who presents for Bare-metal stent (6 () 2003, hypertension, hyperlipidemia, and diabetes.  He is doing well.  He denies chest pain dyspnea.  No orthopnea, PND, edema, or other complaints.  Has not had palpitations.  Occasionally has left anterior thigh discomfort.  Present both sitting and standing.  Does not bother him that much with walking.   Past Medical History:  Diagnosis Date  . CAD (coronary artery disease)     PTCA and stent March 2003  . Coronary atherosclerosis of unspecified type of vessel, native or graft   . Depressed bipolar disorder (HCC)   . DJD (degenerative joint disease)    Dr. Luiz BlareGraves  . Erectile dysfunction   . HTN (hypertension)   . Hyperlipidemia   . OSA (obstructive sleep apnea)   . Other testicular hypofunction     Past Surgical History:  Procedure Laterality Date  . L knee replacement, [name]    . R knee replacement, Turner [place]an      Current Medications: Outpatient Medications Prior to Visit  Medication Sig Dispense Refill  . amLODipine (NORVASC) 5 MG tablet Take 5 mg by mouth daily.    Marland Kitchen. aspirin EC 81 MG tablet Take 81 mg by mouth daily.    . Cholecalciferol (VITAMIN D PO) Take 1 tablet by mouth daily. daily    . escitalopram (LEXAPRO) 20 MG tablet Take 20 mg by mouth daily.    . Insulin Glargine (TOUJEO SOLOSTAR [place]) Inject 18 Units into the skin daily.    . metFORMIN (GLUCOPHAGE) 500 MG tablet Take 500 mg by mouth 2 (two) times daily with a meal.     . NITROSTAT 0.4 MG SL tablet PLACE 1 TABLET UNDER THE TONGUE EVERY 5 MINUTES FOR CHEST PAIN,UP TO 3 TABLETS 25 tablet 0  . simvastatin (ZOCOR) 20 MG tablet Take 20 mg by mouth daily at 6 PM.     . spironolactone (ALDACTONE) 25 MG tablet Take 25  mg by mouth daily.    Marland Kitchen. aspirin 325 MG tablet Take 325 mg by mouth daily.    Marland Kitchen. LEVEMIR FLEXTOUCH 100 UNIT/ML Pen Inject 10 Units into the skin daily. As directed    . TOUJEO SOLOSTAR 300 UNIT/ML SOPN Inject 300 mLs as directed as directed.     No facility-administered medications prior to visit.      Allergies:   Patient has no known allergies.   Social History   Socioeconomic History  . Marital status: Married    Spouse name: Not on file  . Number of children: Not on file  . Years of education: Not on file  . Highest education level: Not on file  Occupational History  . Not on file  Social Needs  . Financial resource strain: Not on file  . Food insecurity:    Worry: Not on file    Inability: Not on file  . Transportation needs:    Medical: Not on file    Non-medical: Not on file  Tobacco Use  . Smoking status: Former Games developermoker  . Smokeless tobacco: Never Used  . Tobacco comment: QUIT 76YRS AGO  Substance and Sexual Activity  . Alcohol use: Yes    Comment:  EVERY DAY WINE  . Drug use: No  . Sexual activity: Not on file  Lifestyle  . Physical activity:    Days per week: Not on file    Minutes per session: Not on file  . Stress: Not on file  Relationships  . Social connections:    Talks on phone: Not on file    Gets together: Not on file    Attends religious service: Not on file    Active member of club or organization: Not on file    Attends meetings of clubs or organizations: Not on file    Relationship status: Not on file  Other Topics Concern  . Not on file  Social History Narrative  . Not on file     Family History:  The patient's family history is not on file.   ROS:   Please see the history of present illness.    Occasional low back discomfort.  Coincidental finding of asymptomatic kidney stone when a colonoscopy and barium enema were performed. All other systems reviewed and are negative.   PHYSICAL EXAM:   VS:  BP 112/62   Pulse 61   Ht 5' 10.5"  (1.791 m)   Wt (!) 313 lb 6.4 oz (142.2 kg)   SpO2 92%   BMI 44.33 kg/m    GEN: Morbid obesity, well developed, in no acute distress  HEENT: normal  Neck: no JVD, carotid bruits, or masses Cardiac: RRR; no murmurs, rubs, or gallops,no edema  Respiratory:  clear to auscultation bilaterally, normal work of breathing GI: soft, nontender, nondistended, + BS MS: no deformity or atrophy  Skin: warm and dry, no rash Neuro:  Alert and Oriented x 3, Strength and sensation are intact Psych: euthymic mood, full affect  Wt Readings from Last 3 Encounters:  01/25/18 (!) 313 lb 6.4 oz (142.2 kg)  12/04/16 (!) 323 lb 6.4 oz (146.7 kg)  10/15/15 (!) 329 lb 9.6 oz (149.5 kg)      Studies/Labs Reviewed:   EKG:  EKG sinus rhythm, right bundle, left anterior hemiblock, and when compared to prior tracings, no significant change has occurred since the April 2018 study.  Recent Labs: No results found for requested labs within last 8760 hours.   Lipid Panel No results found for: CHOL, TRIG, HDL, CHOLHDL, VLDL, LDLCALC, LDLDIRECT  Additional studies/ records that were reviewed today include:  No recent imaging studies.    ASSESSMENT:    1. Coronary artery disease involving native coronary artery of native heart without angina pectoris   2. Depressed bipolar disorder (HCC)   3. Essential hypertension   4. Other hyperlipidemia   5. Right bundle branch block (RBBB) with left anterior hemiblock      PLAN:  In order of problems listed above:  1. Bare-metal stent in 2003.  No symptoms since that time.  Did not have a myocardial infarction.  Had dyspnea and chest discomfort on exertion. 2. Has bifascicular block with no change on EKG since the last tracing. 3. Excellent blood pressure control.  Target less than 130/80 mmHg. 4. LDL of 40.  Continue current regimen with simvastatin 20.  I encouraged the patient be more physically active and include at least 3 [facility]  He will try.  Clinical follow-up in 1 year.    Medication Adjustments/Labs and Tests Ordered: Current medicines are reviewed at length with the patient today.  Concerns regarding medicines are  outlined above.  Medication changes, Labs and Tests ordered today are listed in the Patient Instructions below. Patient Instructions  Medication Instructions:  Your physician recommends that you continue on your current medications as directed. Please refer to the Current Medication list given to you today.   Labwork: None  Testing/Procedures: None  Follow-Up: Your physician wants you to follow-up in: 1 year with Dr. Katrinka Blazing.  You will receive a reminder letter in the mail two months in advance. If you don't receive a letter, please call our office to schedule the follow-up appointment.   Any Other Special Instructions Will Be Listed Below (If Applicable).     If you need a refill on your cardiac medications before your next appointment, please call your pharmacy.      Signed, Lesleigh Noe, MD  01/25/2018 2:19 PM    [facility] [address] [place], [place], [place]  [address] Phone: [phone]; Fax: [phone]

## 2018-01-25 ENCOUNTER — Encounter: Payer: Self-pay | Admitting: Interventional Cardiology

## 2018-01-25 ENCOUNTER — Ambulatory Visit: Payer: Medicare HMO | Admitting: Interventional Cardiology

## 2018-01-25 VITALS — BP 112/62 | HR 61 | Ht 70.5 in | Wt 313.4 lb

## 2018-01-25 DIAGNOSIS — F319 Bipolar disorder, unspecified: Secondary | ICD-10-CM | POA: Diagnosis not present

## 2018-01-25 DIAGNOSIS — Z794 Long term (current) use of insulin: Secondary | ICD-10-CM | POA: Diagnosis not present

## 2018-01-25 DIAGNOSIS — I10 Essential (primary) hypertension: Secondary | ICD-10-CM | POA: Diagnosis not present

## 2018-01-25 DIAGNOSIS — I452 Bifascicular block: Secondary | ICD-10-CM | POA: Diagnosis not present

## 2018-01-25 DIAGNOSIS — E119 Type 2 diabetes mellitus without complications: Secondary | ICD-10-CM | POA: Diagnosis not present

## 2018-01-25 DIAGNOSIS — I2510 Atherosclerotic heart disease of native coronary artery without angina pectoris: Secondary | ICD-10-CM | POA: Diagnosis not present

## 2018-01-25 DIAGNOSIS — E7849 Other hyperlipidemia: Secondary | ICD-10-CM | POA: Diagnosis not present

## 2018-01-25 NOTE — Patient Instructions (Signed)

## 2018-02-15 ENCOUNTER — Other Ambulatory Visit: Payer: Self-pay | Admitting: Internal Medicine

## 2018-02-15 ENCOUNTER — Ambulatory Visit
Admission: RE | Admit: 2018-02-15 | Discharge: 2018-02-15 | Disposition: A | Discharge status: Home / Self Care | Payer: Medicare HMO | Source: Ambulatory Visit | Attending: Internal Medicine | Admitting: Internal Medicine

## 2018-02-15 DIAGNOSIS — M25551 Pain in right hip: Secondary | ICD-10-CM

## 2018-02-15 DIAGNOSIS — M1611 Unilateral primary osteoarthritis, right hip: Secondary | ICD-10-CM | POA: Diagnosis not present

## 2018-03-01 DIAGNOSIS — R3129 Other microscopic hematuria: Secondary | ICD-10-CM | POA: Diagnosis not present

## 2018-03-26 ENCOUNTER — Ambulatory Visit: Payer: Medicare HMO | Admitting: Interventional Cardiology

## 2018-03-26 ENCOUNTER — Encounter

## 2018-04-06 DIAGNOSIS — K5900 Constipation, unspecified: Secondary | ICD-10-CM | POA: Diagnosis not present

## 2018-04-06 DIAGNOSIS — K921 Melena: Secondary | ICD-10-CM | POA: Diagnosis not present

## 2018-04-08 DIAGNOSIS — N400 Enlarged prostate without lower urinary tract symptoms: Secondary | ICD-10-CM | POA: Diagnosis not present

## 2018-04-08 DIAGNOSIS — R311 Benign essential microscopic hematuria: Secondary | ICD-10-CM | POA: Diagnosis not present

## 2018-04-19 DIAGNOSIS — K8020 Calculus of gallbladder without cholecystitis without obstruction: Secondary | ICD-10-CM | POA: Diagnosis not present

## 2018-04-19 DIAGNOSIS — R311 Benign essential microscopic hematuria: Secondary | ICD-10-CM | POA: Diagnosis not present

## 2018-05-03 DIAGNOSIS — I10 Essential (primary) hypertension: Secondary | ICD-10-CM | POA: Diagnosis not present

## 2018-05-03 DIAGNOSIS — Z23 Encounter for immunization: Secondary | ICD-10-CM | POA: Diagnosis not present

## 2018-05-03 DIAGNOSIS — E1169 Type 2 diabetes mellitus with other specified complication: Secondary | ICD-10-CM | POA: Diagnosis not present

## 2018-06-03 DIAGNOSIS — R311 Benign essential microscopic hematuria: Secondary | ICD-10-CM | POA: Diagnosis not present

## 2018-06-10 ENCOUNTER — Other Ambulatory Visit: Payer: Self-pay | Admitting: Internal Medicine

## 2018-06-10 DIAGNOSIS — K869 Disease of pancreas, unspecified: Secondary | ICD-10-CM

## 2018-06-20 ENCOUNTER — Ambulatory Visit
Admission: RE | Admit: 2018-06-20 | Discharge: 2018-06-20 | Disposition: A | Discharge status: Home / Self Care | Payer: Medicare HMO | Source: Ambulatory Visit | Attending: Internal Medicine | Admitting: Internal Medicine

## 2018-06-20 DIAGNOSIS — K760 Fatty (change of) liver, not elsewhere classified: Secondary | ICD-10-CM | POA: Diagnosis not present

## 2018-06-20 DIAGNOSIS — K869 Disease of pancreas, unspecified: Secondary | ICD-10-CM

## 2018-06-20 DIAGNOSIS — K8020 Calculus of gallbladder without cholecystitis without obstruction: Secondary | ICD-10-CM | POA: Diagnosis not present

## 2018-06-20 MED ORDER — GADOBENATE DIMEGLUMINE 529 MG/ML IV SOLN
20.0000 mL | Freq: Once | INTRAVENOUS | Status: AC | PRN
  Administered 2018-06-20: 20 mL via INTRAVENOUS

## 2018-07-02 ENCOUNTER — Other Ambulatory Visit: Payer: Self-pay | Admitting: Internal Medicine

## 2018-07-02 ENCOUNTER — Ambulatory Visit
Admission: RE | Admit: 2018-07-02 | Discharge: 2018-07-02 | Disposition: A | Discharge status: Home / Self Care | Payer: Medicare HMO | Source: Ambulatory Visit | Attending: Internal Medicine | Admitting: Internal Medicine

## 2018-07-02 DIAGNOSIS — M542 Cervicalgia: Secondary | ICD-10-CM | POA: Diagnosis not present

## 2018-07-02 DIAGNOSIS — S058X1A Other injuries of right eye and orbit, initial encounter: Secondary | ICD-10-CM | POA: Diagnosis not present

## 2018-09-08 ENCOUNTER — Encounter: Payer: Self-pay | Admitting: Internal Medicine

## 2018-09-08 DIAGNOSIS — I110 Hypertensive heart disease with heart failure: Secondary | ICD-10-CM | POA: Diagnosis not present

## 2018-09-08 DIAGNOSIS — I2510 Atherosclerotic heart disease of native coronary artery without angina pectoris: Secondary | ICD-10-CM | POA: Diagnosis not present

## 2018-09-08 DIAGNOSIS — G4733 Obstructive sleep apnea (adult) (pediatric): Secondary | ICD-10-CM | POA: Diagnosis not present

## 2018-09-08 DIAGNOSIS — Z794 Long term (current) use of insulin: Secondary | ICD-10-CM | POA: Diagnosis not present

## 2018-09-08 DIAGNOSIS — I10 Essential (primary) hypertension: Secondary | ICD-10-CM | POA: Diagnosis not present

## 2018-09-08 DIAGNOSIS — E1169 Type 2 diabetes mellitus with other specified complication: Secondary | ICD-10-CM | POA: Diagnosis not present

## 2018-09-08 DIAGNOSIS — Z6841 Body Mass Index (BMI) 40.0 and over, adult: Secondary | ICD-10-CM | POA: Diagnosis not present

## 2018-09-08 DIAGNOSIS — I5032 Chronic diastolic (congestive) heart failure: Secondary | ICD-10-CM | POA: Diagnosis not present

## 2018-09-10 DIAGNOSIS — E1165 Type 2 diabetes mellitus with hyperglycemia: Secondary | ICD-10-CM | POA: Diagnosis not present

## 2018-09-10 DIAGNOSIS — Z794 Long term (current) use of insulin: Secondary | ICD-10-CM | POA: Diagnosis not present

## 2018-09-19 IMAGING — RF DG BE W/ CM - WO/W KUB
1 series · 15 of 16 positions shown · IV contrast (agent unspecified)
Comparison: None.

CLINICAL DATA: Blood in stool.  Incomplete colonoscopy

EXAM:
BE WITH CONTRAST - WITHOUT AND WITH KUB
CONTRAST:  Single-contrast barium enema
FLUOROSCOPY TIME:  Fluoroscopy Time:  2 minutes 18 seconds
Radiation Exposure Index (if provided by the fluoroscopic device):
Number of Acquired Spot Images: 0

[Series 1: one shot · 15 of 16 slices shown]
[im 1/16]
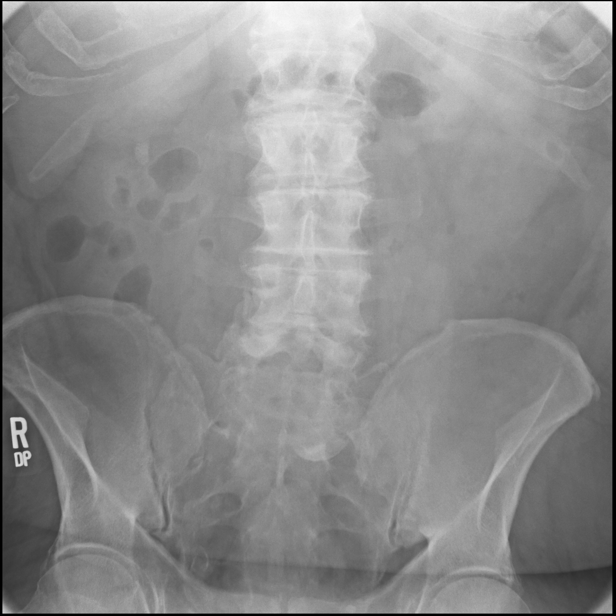
[im 2/16]
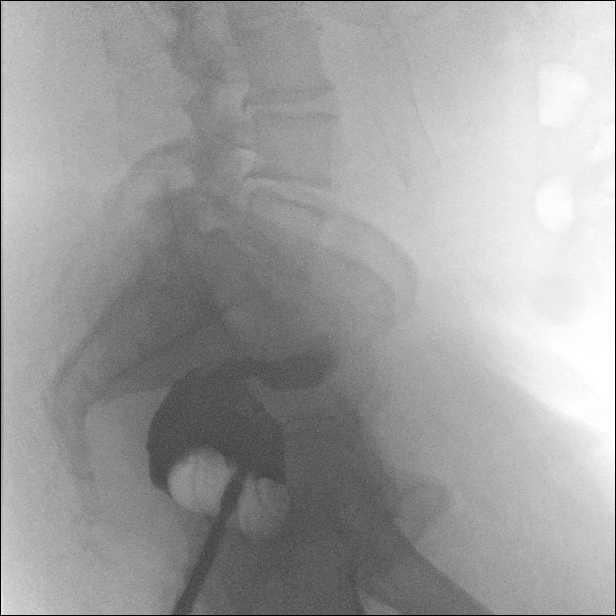
[im 3/16]
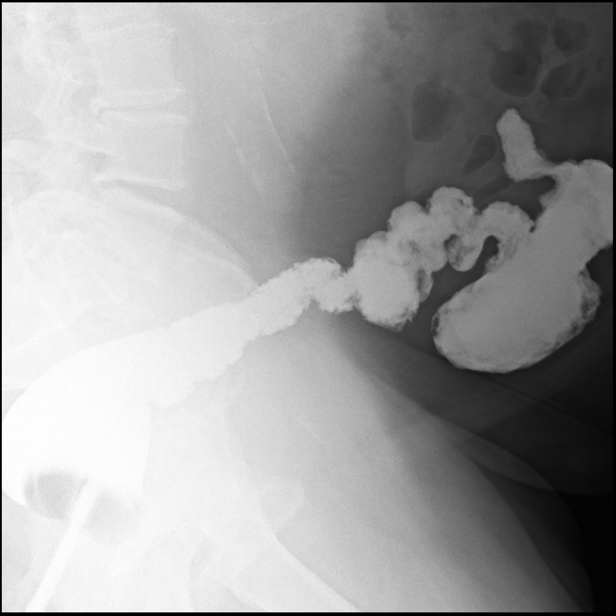
[im 4/16]
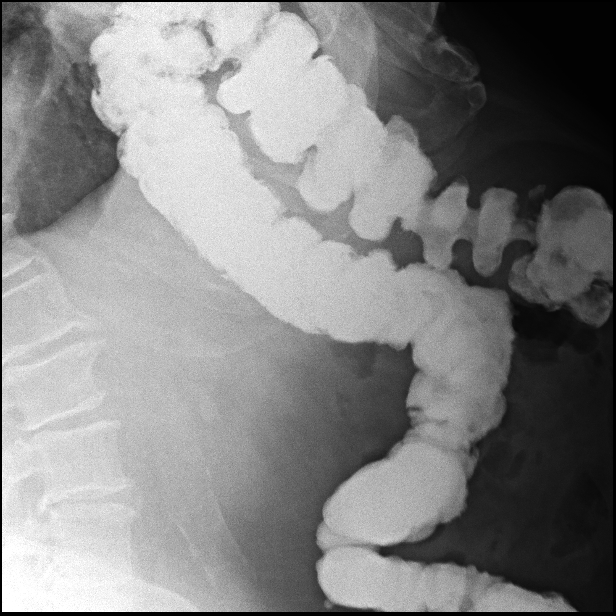
[im 5/16]
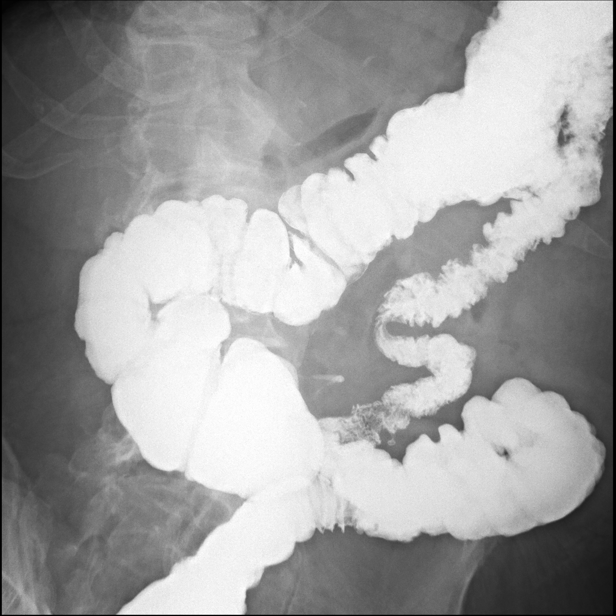
[im 6/16]
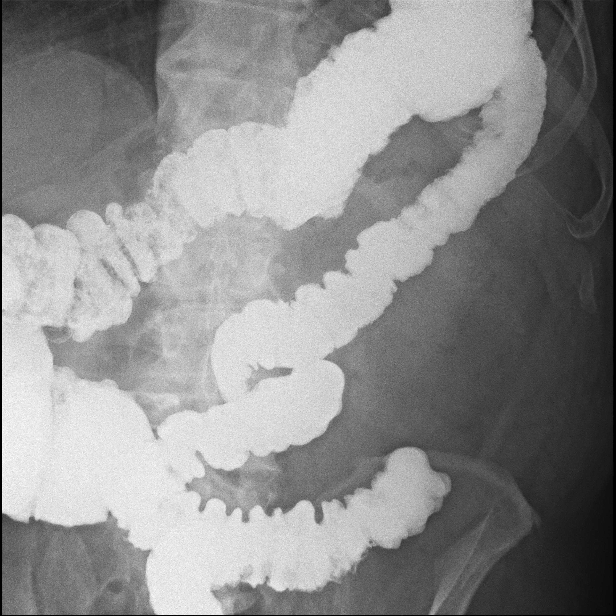
[im 7/16]
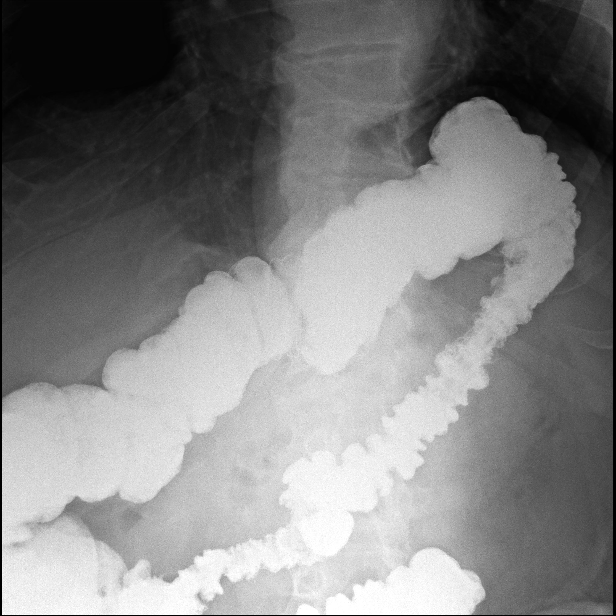
[im 9/16]
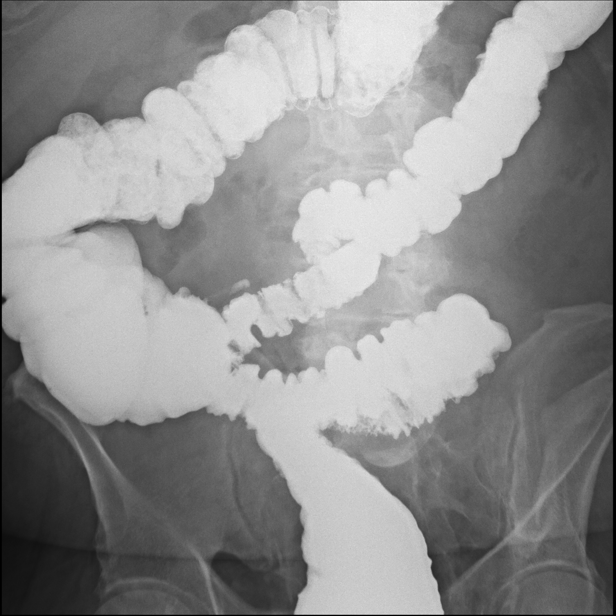
[im 10/16]
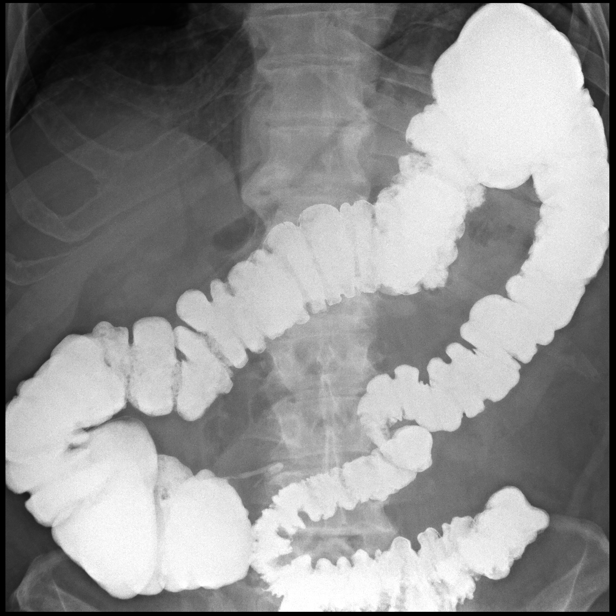
[im 11/16]
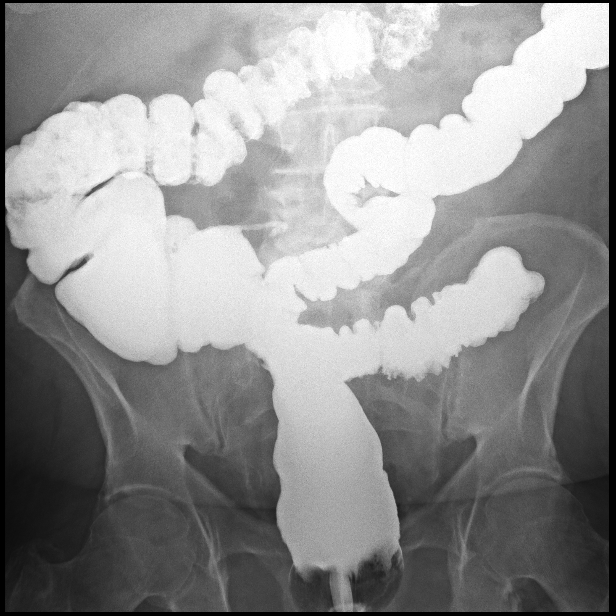
[im 12/16]
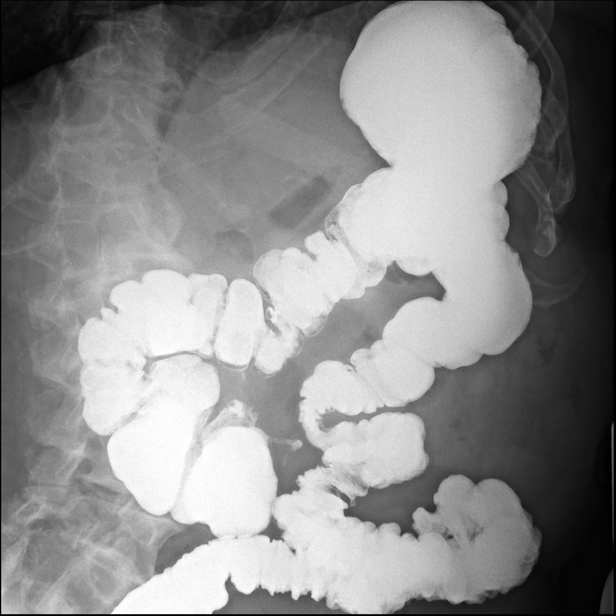
[im 13/16]
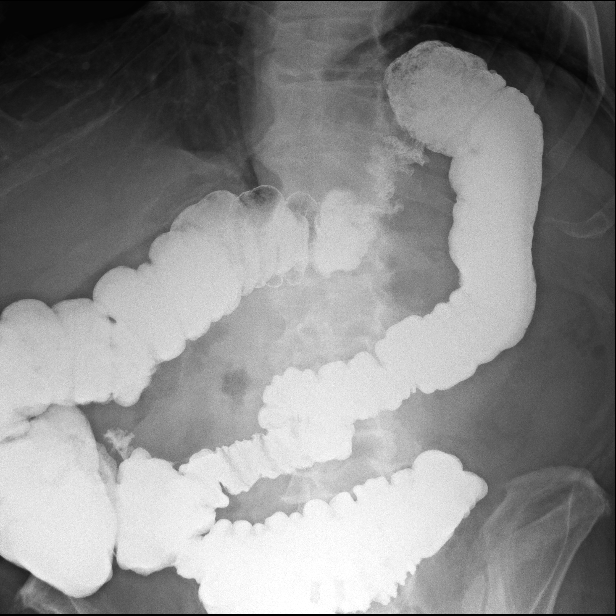
[im 14/16]
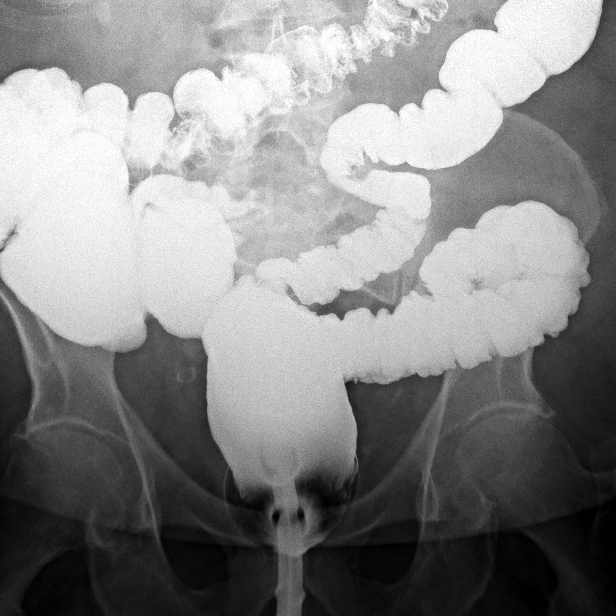
[im 15/16]
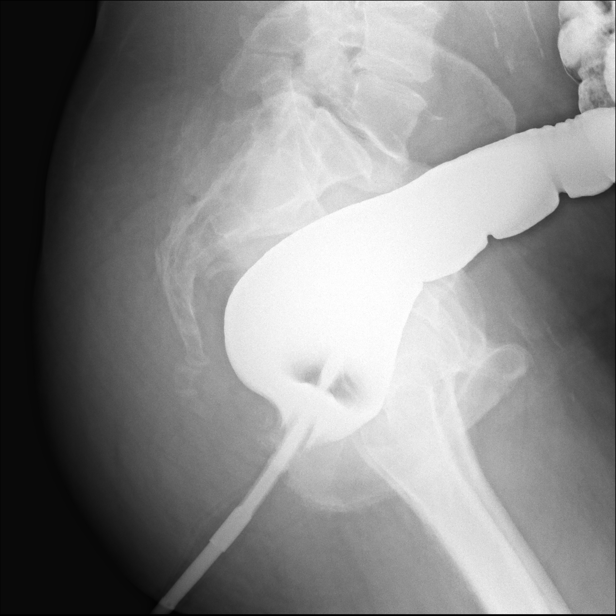
[im 16/16]
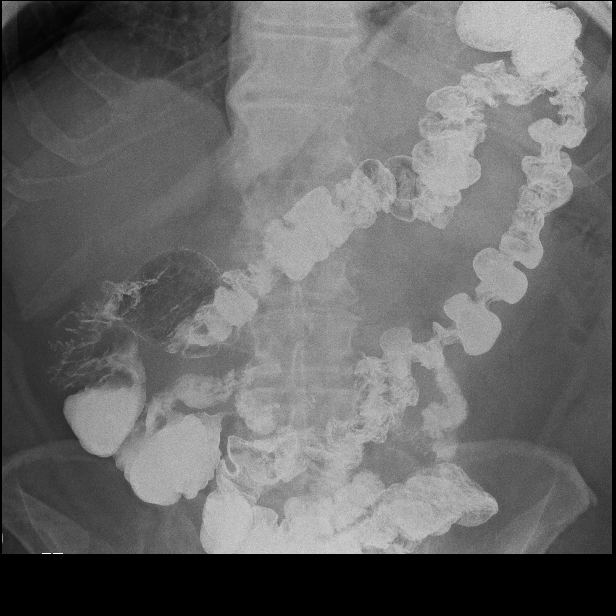

[15 of 16 positions shown; findings below may reference images not displayed]

FINDINGS: KUB demonstrates normal bowel gas pattern. Degenerative changes in
the lumbar spine. 10 x 15 mm right renal calculus

Barium was refluxed to the cecum. The appendix is visualized. No
reflux into the terminal ileum.

Mild amount of retained stool in the colon. No mass or polyp
identified. No bowel wall edema. No significant diverticular change.
Colon Empties well on the post evacuation image.
IMPRESSION: Negative for mass or bowel edema. Mild amount of retained stool in
the colon

Right renal calculus

## 2018-09-30 DIAGNOSIS — E1169 Type 2 diabetes mellitus with other specified complication: Secondary | ICD-10-CM | POA: Diagnosis not present

## 2018-09-30 DIAGNOSIS — I2510 Atherosclerotic heart disease of native coronary artery without angina pectoris: Secondary | ICD-10-CM | POA: Diagnosis not present

## 2018-09-30 DIAGNOSIS — I10 Essential (primary) hypertension: Secondary | ICD-10-CM | POA: Diagnosis not present

## 2018-09-30 DIAGNOSIS — Z794 Long term (current) use of insulin: Secondary | ICD-10-CM | POA: Diagnosis not present

## 2018-09-30 DIAGNOSIS — M179 Osteoarthritis of knee, unspecified: Secondary | ICD-10-CM | POA: Diagnosis not present

## 2018-09-30 DIAGNOSIS — E1165 Type 2 diabetes mellitus with hyperglycemia: Secondary | ICD-10-CM | POA: Diagnosis not present

## 2018-09-30 DIAGNOSIS — F325 Major depressive disorder, single episode, in full remission: Secondary | ICD-10-CM | POA: Diagnosis not present

## 2018-09-30 DIAGNOSIS — I5032 Chronic diastolic (congestive) heart failure: Secondary | ICD-10-CM | POA: Diagnosis not present

## 2018-11-05 DIAGNOSIS — E1169 Type 2 diabetes mellitus with other specified complication: Secondary | ICD-10-CM | POA: Diagnosis not present

## 2018-11-05 DIAGNOSIS — I10 Essential (primary) hypertension: Secondary | ICD-10-CM | POA: Diagnosis not present

## 2018-11-05 DIAGNOSIS — Z7984 Long term (current) use of oral hypoglycemic drugs: Secondary | ICD-10-CM | POA: Diagnosis not present

## 2018-11-05 DIAGNOSIS — I5032 Chronic diastolic (congestive) heart failure: Secondary | ICD-10-CM | POA: Diagnosis not present

## 2018-11-05 DIAGNOSIS — I2510 Atherosclerotic heart disease of native coronary artery without angina pectoris: Secondary | ICD-10-CM | POA: Diagnosis not present

## 2018-11-05 DIAGNOSIS — M179 Osteoarthritis of knee, unspecified: Secondary | ICD-10-CM | POA: Diagnosis not present

## 2018-11-05 DIAGNOSIS — F325 Major depressive disorder, single episode, in full remission: Secondary | ICD-10-CM | POA: Diagnosis not present

## 2018-11-05 DIAGNOSIS — E1165 Type 2 diabetes mellitus with hyperglycemia: Secondary | ICD-10-CM | POA: Diagnosis not present

## 2018-11-24 DIAGNOSIS — M179 Osteoarthritis of knee, unspecified: Secondary | ICD-10-CM | POA: Diagnosis not present

## 2018-11-24 DIAGNOSIS — E1169 Type 2 diabetes mellitus with other specified complication: Secondary | ICD-10-CM | POA: Diagnosis not present

## 2018-11-24 DIAGNOSIS — Z7984 Long term (current) use of oral hypoglycemic drugs: Secondary | ICD-10-CM | POA: Diagnosis not present

## 2018-11-24 DIAGNOSIS — I10 Essential (primary) hypertension: Secondary | ICD-10-CM | POA: Diagnosis not present

## 2018-11-24 DIAGNOSIS — F325 Major depressive disorder, single episode, in full remission: Secondary | ICD-10-CM | POA: Diagnosis not present

## 2018-11-24 DIAGNOSIS — Z794 Long term (current) use of insulin: Secondary | ICD-10-CM | POA: Diagnosis not present

## 2018-11-24 DIAGNOSIS — E1165 Type 2 diabetes mellitus with hyperglycemia: Secondary | ICD-10-CM | POA: Diagnosis not present

## 2018-11-24 DIAGNOSIS — I5032 Chronic diastolic (congestive) heart failure: Secondary | ICD-10-CM | POA: Diagnosis not present

## 2018-11-24 DIAGNOSIS — I2510 Atherosclerotic heart disease of native coronary artery without angina pectoris: Secondary | ICD-10-CM | POA: Diagnosis not present

## 2018-12-21 DIAGNOSIS — Z794 Long term (current) use of insulin: Secondary | ICD-10-CM | POA: Diagnosis not present

## 2018-12-21 DIAGNOSIS — E1169 Type 2 diabetes mellitus with other specified complication: Secondary | ICD-10-CM | POA: Diagnosis not present

## 2018-12-21 DIAGNOSIS — G4733 Obstructive sleep apnea (adult) (pediatric): Secondary | ICD-10-CM | POA: Diagnosis not present

## 2018-12-21 DIAGNOSIS — F325 Major depressive disorder, single episode, in full remission: Secondary | ICD-10-CM | POA: Diagnosis not present

## 2018-12-21 DIAGNOSIS — Z0000 Encounter for general adult medical examination without abnormal findings: Secondary | ICD-10-CM | POA: Diagnosis not present

## 2018-12-21 DIAGNOSIS — I10 Essential (primary) hypertension: Secondary | ICD-10-CM | POA: Diagnosis not present

## 2018-12-21 DIAGNOSIS — I2510 Atherosclerotic heart disease of native coronary artery without angina pectoris: Secondary | ICD-10-CM | POA: Diagnosis not present

## 2018-12-21 DIAGNOSIS — I110 Hypertensive heart disease with heart failure: Secondary | ICD-10-CM | POA: Diagnosis not present

## 2018-12-21 DIAGNOSIS — I5032 Chronic diastolic (congestive) heart failure: Secondary | ICD-10-CM | POA: Diagnosis not present

## 2018-12-21 DIAGNOSIS — Z1389 Encounter for screening for other disorder: Secondary | ICD-10-CM | POA: Diagnosis not present

## 2018-12-23 ENCOUNTER — Other Ambulatory Visit: Payer: Self-pay | Admitting: Internal Medicine

## 2018-12-23 DIAGNOSIS — K862 Cyst of pancreas: Secondary | ICD-10-CM

## 2019-01-12 ENCOUNTER — Telehealth: Payer: Self-pay

## 2019-01-12 NOTE — Telephone Encounter (Signed)
YOUR CARDIOLOGY TEAM HAS ARRANGED FOR AN E-VISIT FOR YOUR APPOINTMENT - PLEASE REVIEW IMPORTANT INFORMATION BELOW SEVERAL DAYS PRIOR TO YOUR APPOINTMENT ° °Due to the recent COVID-19 pandemic, we are transitioning in-person office visits to tele-medicine visits in an effort to decrease unnecessary exposure to our patients, their families, and staff. These visits are billed to your insurance just like a normal visit is. We also encourage you to sign up for MyChart if you have not already done so. You will need a smartphone if possible. For patients that do not have this, we can still complete the visit using a regular telephone but do prefer a smartphone to enable video when possible. You may have a family member that lives with you that can help. If possible, we also ask that you have a blood pressure cuff and scale at home to measure your blood pressure, heart rate and weight prior to your scheduled appointment. Patients with clinical needs that need an in-person evaluation and testing will still be able to come to the office if absolutely necessary. If you have any questions, feel free to call our office. ° ° ° ° °YOUR PROVIDER WILL BE USING THE FOLLOWING PLATFORM TO COMPLETE YOUR VISIT: Phone Call ° °• IF USING MYCHART - How to Download the MyChart App to Your SmartPhone  ° °- If Apple, go to App Store and type in MyChart in the search bar and download the app. If Android, ask patient to go to Google Play Store and type in MyChart in the search bar and download the app. The app is free but as with any other app downloads, your phone may require you to verify saved payment information or Apple/Android password.  °- You will need to then log into the app with your MyChart username and password, and select [facility] as your healthcare provider to link the account.  °- When it is time for your visit, go to the MyChart app, find appointments, and click Begin Video Visit. Be sure to Select Allow for your device to  access the Microphone and Camera for your visit. You will then be connected, and your provider will be with you shortly. ° **If you have any issues connecting or need assistance, please contact MyChart service desk [phone] ([phone])** ° **If using a computer, in order to ensure the best quality for your visit, you will need to use either of the following Internet Browsers: Google Chrome or Microsoft Edge** ° °• IF USING DOXIMITY or [email or web address] - The staff will give you instructions on receiving your link to join the meeting the day of your visit.  ° ° ° ° °2-3 DAYS BEFORE YOUR APPOINTMENT ° °You will receive a telephone call from one of our [facility] team members - your caller ID may say "Unknown caller." If this is a video visit, we will walk you through how to get the video launched on your phone. We will remind you check your blood pressure, heart rate and weight prior to your scheduled appointment. If you have an Apple Watch or Kardia, please upload any pertinent ECG strips the day before or morning of your appointment to MyChart. Our staff will also make sure you have reviewed the consent and agree to move forward with your scheduled tele-health visit.  ° ° ° °THE DAY OF YOUR APPOINTMENT ° °Approximately 15 minutes prior to your scheduled appointment, you will receive a telephone call from one of [facility] team - your caller ID may say "Unknown caller."    Our staff will confirm medications, vital signs for the day and any symptoms you may be experiencing. Please have this information available prior to the time of visit start. It may also be helpful for you to have a pad of paper and pen handy for any instructions given during your visit. They will also walk you through joining the smartphone meeting if this is a video visit. ° ° ° °CONSENT FOR TELE-HEALTH VISIT - PLEASE REVIEW ° °I hereby voluntarily request, consent and authorize [facility] and its employed or contracted physicians, physician  assistants, nurse practitioners or other licensed health care professionals (the Practitioner), to provide me with telemedicine health care services (the “Services") as deemed necessary by the treating Practitioner. I acknowledge and consent to receive the Services by the Practitioner via telemedicine. I understand that the telemedicine visit will involve communicating with the Practitioner through live audiovisual communication technology and the disclosure of certain medical information by electronic transmission. I acknowledge that I have been given the opportunity to request an in-person assessment or other available alternative prior to the telemedicine visit and am voluntarily participating in the telemedicine visit. ° °I understand that I have the right to withhold or withdraw my consent to the use of telemedicine in the course of my care at any time, without affecting my right to future care or treatment, and that the Practitioner or I may terminate the telemedicine visit at any time. I understand that I have the right to inspect all information obtained and/or recorded in the course of the telemedicine visit and may receive copies of available information for a reasonable fee.  I understand that some of the potential risks of receiving the Services via telemedicine include:  °• Delay or interruption in medical evaluation due to technological equipment failure or disruption; °• Information transmitted may not be sufficient (e.g. poor resolution of images) to allow for appropriate medical decision making by the Practitioner; and/or  °• In rare instances, security protocols could fail, causing a breach of personal health information. ° °Furthermore, I acknowledge that it is my responsibility to provide information about my medical history, conditions and care that is complete and accurate to the best of my ability. I acknowledge that Practitioner's advice, recommendations, and/or decision may be based on  factors not within their control, such as incomplete or inaccurate data provided by me or distortions of diagnostic images or specimens that may result from electronic transmissions. I understand that the practice of medicine is not an exact science and that Practitioner makes no warranties or guarantees regarding treatment outcomes. I acknowledge that I will receive a copy of this consent concurrently upon execution via email to the email address I last provided but may also request a printed copy by calling the office of [facility].   ° °I understand that my insurance will be billed for this visit.  ° °I have read or had this consent read to me. °• I understand the contents of this consent, which adequately explains the benefits and risks of the Services being provided via telemedicine.  °• I have been provided ample opportunity to ask questions regarding this consent and the Services and have had my questions answered to my satisfaction. °• I give my informed consent for the services to be provided through the use of telemedicine in my medical care ° °By participating in this telemedicine visit I agree to the above. °

## 2019-01-16 NOTE — Progress Notes (Signed)
Virtual Visit via Video Note   This visit type was conducted due to national recommendations for restrictions regarding the COVID-19 Pandemic (e.g. social distancing) in an effort to limit this patient's exposure and mitigate transmission in our community.  Due to his co-morbid illnesses, this patient is at least at moderate risk for complications without adequate follow up.  This format is felt to be most appropriate for this patient at this time.  All issues noted in this document were discussed and addressed.  A limited physical exam was performed with this format.  Please refer to the patient's chart for his consent to telehealth for [facility].   Date:  01/16/2019   ID:  IORI GIGANTE, DOB Apr 28, 1934, MRN [id number]  Patient Location: Home Provider Location: Home  PCP:  Lavone Orn, MD  Cardiologist:  No primary care provider on file.  Electrophysiologist:  None   Evaluation Performed:  Follow-Up Visit  Chief Complaint:  CAD  History of Present Illness:    Charles Bonilla is a 85 y.o. male with a bare-metal stent (01/2002), hypertension, hyperlipidemia, OSA,  and diabetes mellitus II.  He has no complaints. Reading, eating, sleeping, and drinking. Has some depression related to COVID 19 pandemic.  The patient does not have symptoms concerning for COVID-19 infection (fever, chills, cough, or new shortness of breath).    Past Medical History:  Diagnosis Date  . CAD (coronary artery disease)     PTCA and stent March 2003  . Coronary atherosclerosis of unspecified type of vessel, native or graft   . Depressed bipolar disorder ([place])   . DJD (degenerative joint disease)    Dr. Berenice Primas  . Erectile dysfunction   . HTN (hypertension)   . Hyperlipidemia   . OSA (obstructive sleep apnea)   . Other testicular hypofunction    Past Surgical History:  Procedure Laterality Date  . L knee replacement, [name]    . R knee replacement, [name]       No outpatient medications  have been marked as taking for the 01/17/19 encounter (Appointment) with Belva Crome, MD.     Allergies:   Patient has no known allergies.   Social History   Tobacco Use  . Smoking status: Former Research scientist (life sciences)  . Smokeless tobacco: Never Used  . Tobacco comment: QUIT 38YRS AGO  Substance Use Topics  . Alcohol use: Yes    Comment: EVERY DAY WINE  . Drug use: No     Family Hx: The patient's family history is not on file.  ROS:   Please see the history of present illness.    Arthritis in his knees.  Not getting any aerobic activity.  Major sedentary quality of life. All other systems reviewed and are negative.   Prior CV studies:   The following studies were reviewed today:  No recent functional testing  Labs/Other Tests and Data Reviewed:    EKG:  No ECG reviewed.  Recent Labs: No results found for requested labs within last 8760 hours.   Recent Lipid Panel No results found for: CHOL, TRIG, HDL, CHOLHDL, LDLCALC, LDLDIRECT  Wt Readings from Last 3 Encounters:  01/25/18 (!) 313 lb 6.4 oz (142.2 kg)  12/04/16 (!) 323 lb 6.4 oz (146.7 kg)  10/15/15 (!) 329 lb 9.6 oz (149.5 kg)     Objective:    Vital Signs:  There were no vitals taken for this visit.   VITAL SIGNS:  reviewed  ASSESSMENT & PLAN:    1. Coronary  artery disease involving native coronary artery of native heart without angina pectoris   2. Essential hypertension   3. Other hyperlipidemia   4. Right bundle branch block (RBBB) with left anterior hemiblock   5. Controlled type 2 diabetes mellitus without complication, with long-term current use of insulin (HCC)   6. Educated About Covid-19 Virus Infection    PLAN:  1. Secondary prevention discussed in some detail.  He is doing terribly with physical activity metric.  I strongly encouraged walking and aerobic activity. 2. Typical blood pressures run less than 130/80 according to the patient. 3. We will obtain the most recent laboratory data from Dr. Kirby FunkJohn  Griffin. 4. Unable to evaluate right bundle because we are meeting virtually. 5. Hemoglobin A1c target less than 7.  Will review results.  May need to consider SGLT2 therapy.  Overall education and awareness concerning primary/secondary risk prevention was discussed in detail: LDL less than 70, hemoglobin A1c less than 7, blood pressure target less than 130/80 mmHg, >150 minutes of moderate aerobic activity per week, avoidance of smoking, weight control (via diet and exercise), and continued surveillance/management of/for obstructive sleep apnea.    COVID-19 Education: The signs and symptoms of COVID-19 were discussed with the patient and how to seek care for testing (follow up with PCP or arrange E-visit).  The importance of social distancing was discussed today.  Time:   Today, I have spent 15 minutes with the patient with telehealth technology discussing the above problems.     Medication Adjustments/Labs and Tests Ordered: Current medicines are reviewed at length with the patient today.  Concerns regarding medicines are outlined above.   Tests Ordered: No orders of the defined types were placed in this encounter.   Medication Changes: No orders of the defined types were placed in this encounter.   Disposition:  Follow up in 9 month(s)  Signed, Lesleigh NoeHenry W Fayetta Sorenson III, MD  01/16/2019 9:27 AM    [facility]

## 2019-01-17 ENCOUNTER — Telehealth (INDEPENDENT_AMBULATORY_CARE_PROVIDER_SITE_OTHER): Payer: Medicare HMO | Admitting: Interventional Cardiology

## 2019-01-17 ENCOUNTER — Other Ambulatory Visit: Payer: Self-pay

## 2019-01-17 ENCOUNTER — Encounter: Payer: Self-pay | Admitting: Interventional Cardiology

## 2019-01-17 VITALS — BP 136/80 | HR 63 | Ht 70.5 in | Wt 302.0 lb

## 2019-01-17 DIAGNOSIS — I452 Bifascicular block: Secondary | ICD-10-CM

## 2019-01-17 DIAGNOSIS — Z7189 Other specified counseling: Secondary | ICD-10-CM

## 2019-01-17 DIAGNOSIS — E119 Type 2 diabetes mellitus without complications: Secondary | ICD-10-CM | POA: Diagnosis not present

## 2019-01-17 DIAGNOSIS — E7849 Other hyperlipidemia: Secondary | ICD-10-CM

## 2019-01-17 DIAGNOSIS — Z794 Long term (current) use of insulin: Secondary | ICD-10-CM

## 2019-01-17 DIAGNOSIS — I10 Essential (primary) hypertension: Secondary | ICD-10-CM | POA: Diagnosis not present

## 2019-01-17 DIAGNOSIS — I2510 Atherosclerotic heart disease of native coronary artery without angina pectoris: Secondary | ICD-10-CM | POA: Diagnosis not present

## 2019-01-17 NOTE — Patient Instructions (Signed)
Medication Instructions:  Your physician recommends that you continue on your current medications as directed. Please refer to the Current Medication list given to you today.  If you need a refill on your cardiac medications before your next appointment, please call your pharmacy.   Lab work: None If you have labs (blood work) drawn today and your tests are completely normal, you will receive your results only by: Marland Kitchen MyChart Message (if you have MyChart) OR . A paper copy in the mail If you have any lab test that is abnormal or we need to change your treatment, we will call you to review the results.  Testing/Procedures: None  Follow-Up: At [facility], you and your health needs are our priority.  As part of our continuing mission to provide you with exceptional heart care, we have created designated Provider Care Teams.  These Care Teams include your primary Cardiologist (physician) and Advanced Practice Providers (APPs -  Physician Assistants and Nurse Practitioners) who all work together to provide you with the care you need, when you need it. You will need a follow up appointment in 9 months.  Please call our office 2 months in advance to schedule this appointment.  You may see Dr. Cleophas Dunker or one of the following Advanced Practice Providers on your designated Care Team:   Truitt Merle, NP Cecilie Kicks, NP . Kathyrn Drown, NP  Any Other Special Instructions Will Be Listed Below (If Applicable).

## 2019-01-18 DIAGNOSIS — Z961 Presence of intraocular lens: Secondary | ICD-10-CM | POA: Diagnosis not present

## 2019-01-18 DIAGNOSIS — E119 Type 2 diabetes mellitus without complications: Secondary | ICD-10-CM | POA: Diagnosis not present

## 2019-01-18 DIAGNOSIS — H5203 Hypermetropia, bilateral: Secondary | ICD-10-CM | POA: Diagnosis not present

## 2019-02-03 DIAGNOSIS — I2510 Atherosclerotic heart disease of native coronary artery without angina pectoris: Secondary | ICD-10-CM | POA: Diagnosis not present

## 2019-02-03 DIAGNOSIS — F325 Major depressive disorder, single episode, in full remission: Secondary | ICD-10-CM | POA: Diagnosis not present

## 2019-02-03 DIAGNOSIS — I10 Essential (primary) hypertension: Secondary | ICD-10-CM | POA: Diagnosis not present

## 2019-02-03 DIAGNOSIS — E1165 Type 2 diabetes mellitus with hyperglycemia: Secondary | ICD-10-CM | POA: Diagnosis not present

## 2019-02-03 DIAGNOSIS — I5032 Chronic diastolic (congestive) heart failure: Secondary | ICD-10-CM | POA: Diagnosis not present

## 2019-02-03 DIAGNOSIS — E1169 Type 2 diabetes mellitus with other specified complication: Secondary | ICD-10-CM | POA: Diagnosis not present

## 2019-02-03 DIAGNOSIS — M179 Osteoarthritis of knee, unspecified: Secondary | ICD-10-CM | POA: Diagnosis not present

## 2019-02-14 ENCOUNTER — Other Ambulatory Visit: Payer: Self-pay

## 2019-02-14 ENCOUNTER — Ambulatory Visit
Admission: RE | Admit: 2019-02-14 | Discharge: 2019-02-14 | Disposition: A | Discharge status: Home / Self Care | Payer: Medicare HMO | Source: Ambulatory Visit | Attending: Internal Medicine | Admitting: Internal Medicine

## 2019-02-14 ENCOUNTER — Other Ambulatory Visit: Payer: Medicare HMO

## 2019-02-14 DIAGNOSIS — K862 Cyst of pancreas: Secondary | ICD-10-CM

## 2019-02-14 DIAGNOSIS — K8020 Calculus of gallbladder without cholecystitis without obstruction: Secondary | ICD-10-CM | POA: Diagnosis not present

## 2019-02-14 MED ORDER — GADOBENATE DIMEGLUMINE 529 MG/ML IV SOLN
20.0000 mL | Freq: Once | INTRAVENOUS | Status: AC | PRN
  Administered 2019-02-14: 20 mL via INTRAVENOUS

## 2019-02-15 DIAGNOSIS — F325 Major depressive disorder, single episode, in full remission: Secondary | ICD-10-CM | POA: Diagnosis not present

## 2019-02-15 DIAGNOSIS — I2510 Atherosclerotic heart disease of native coronary artery without angina pectoris: Secondary | ICD-10-CM | POA: Diagnosis not present

## 2019-02-15 DIAGNOSIS — E1169 Type 2 diabetes mellitus with other specified complication: Secondary | ICD-10-CM | POA: Diagnosis not present

## 2019-02-15 DIAGNOSIS — M179 Osteoarthritis of knee, unspecified: Secondary | ICD-10-CM | POA: Diagnosis not present

## 2019-02-15 DIAGNOSIS — E1165 Type 2 diabetes mellitus with hyperglycemia: Secondary | ICD-10-CM | POA: Diagnosis not present

## 2019-02-15 DIAGNOSIS — I10 Essential (primary) hypertension: Secondary | ICD-10-CM | POA: Diagnosis not present

## 2019-02-15 DIAGNOSIS — Z7984 Long term (current) use of oral hypoglycemic drugs: Secondary | ICD-10-CM | POA: Diagnosis not present

## 2019-02-15 DIAGNOSIS — I5032 Chronic diastolic (congestive) heart failure: Secondary | ICD-10-CM | POA: Diagnosis not present

## 2019-02-23 DIAGNOSIS — E538 Deficiency of other specified B group vitamins: Secondary | ICD-10-CM | POA: Diagnosis not present

## 2019-02-23 DIAGNOSIS — E7800 Pure hypercholesterolemia, unspecified: Secondary | ICD-10-CM | POA: Diagnosis not present

## 2019-02-23 DIAGNOSIS — E1165 Type 2 diabetes mellitus with hyperglycemia: Secondary | ICD-10-CM | POA: Diagnosis not present

## 2019-02-23 DIAGNOSIS — Z7984 Long term (current) use of oral hypoglycemic drugs: Secondary | ICD-10-CM | POA: Diagnosis not present

## 2019-02-23 DIAGNOSIS — G603 Idiopathic progressive neuropathy: Secondary | ICD-10-CM | POA: Diagnosis not present

## 2019-03-01 DIAGNOSIS — E538 Deficiency of other specified B group vitamins: Secondary | ICD-10-CM | POA: Diagnosis not present

## 2019-03-02 IMAGING — CR DG CERVICAL SPINE 2 OR 3 VIEWS
3 series · 3 of 3 positions shown · non-contrast
Comparison: No prior.

CLINICAL DATA: Neck pain.

EXAM:
CERVICAL SPINE - 2-3 VIEW

[w c-spine lat *]
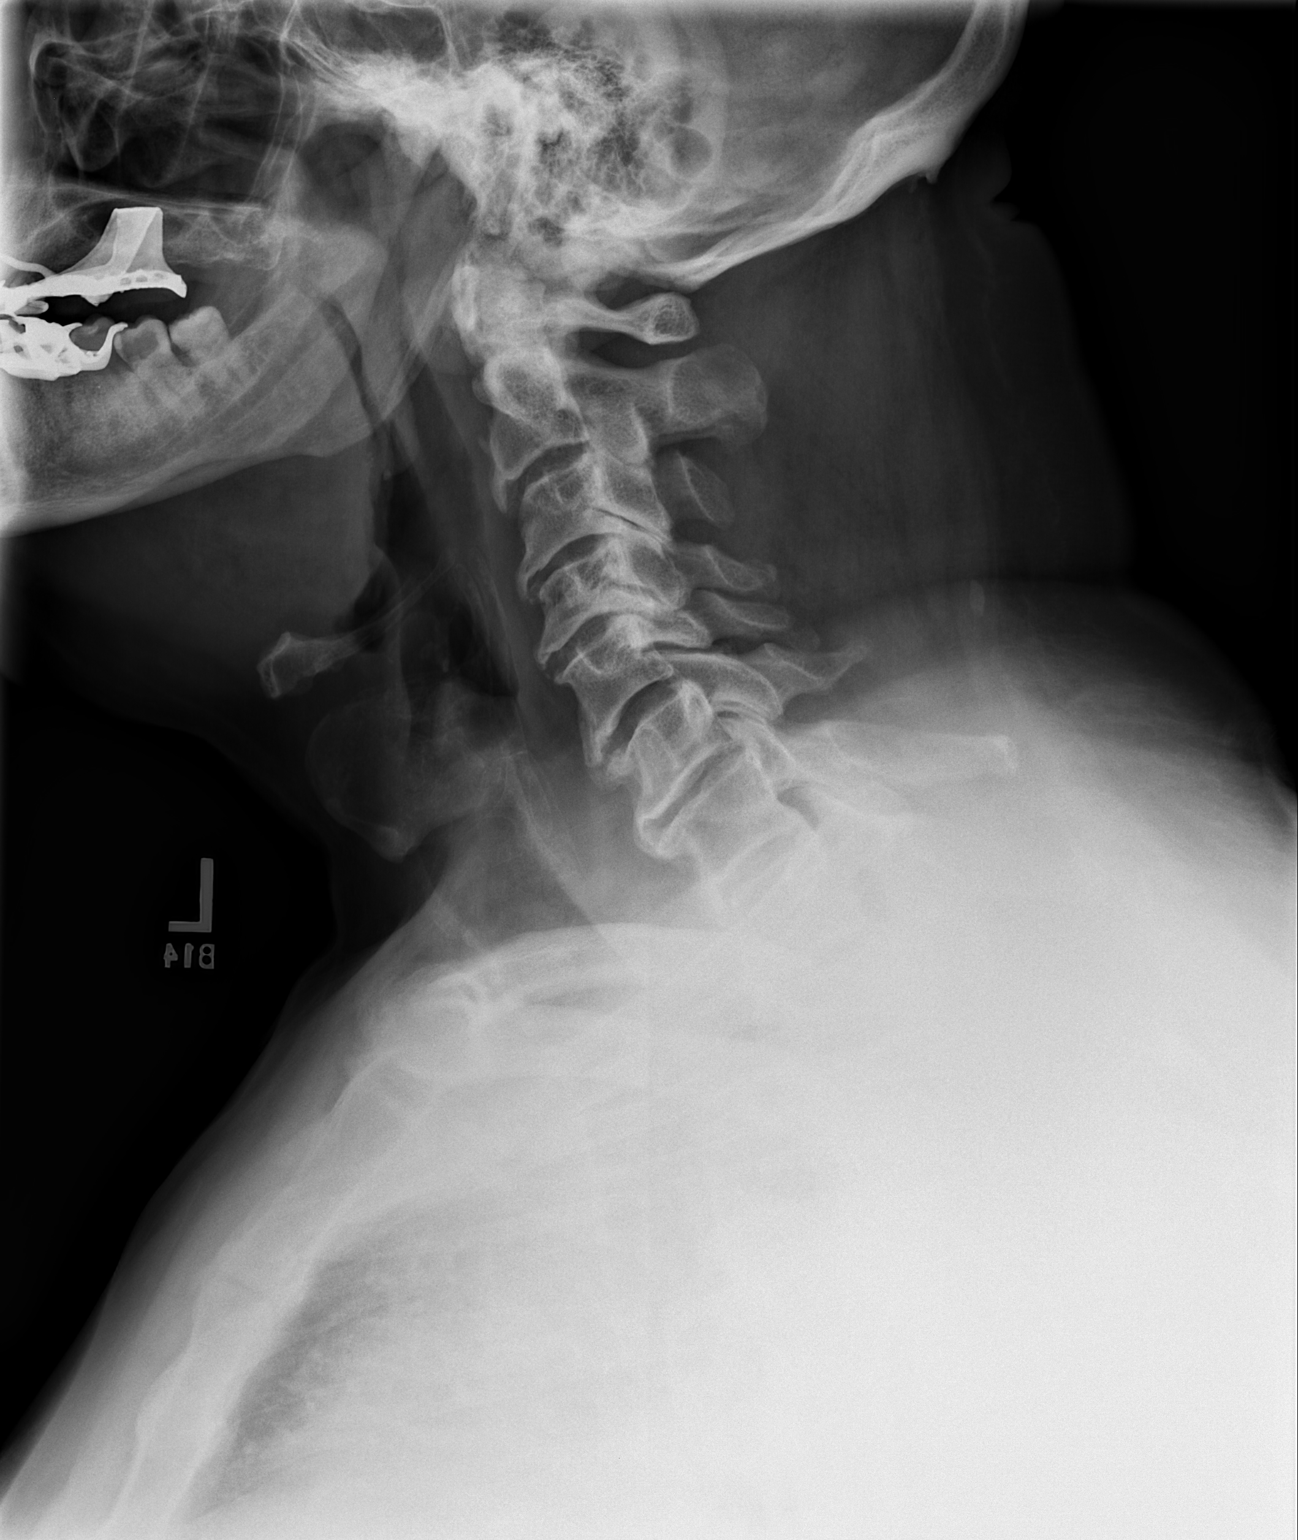

[w c-spine a.p. *]
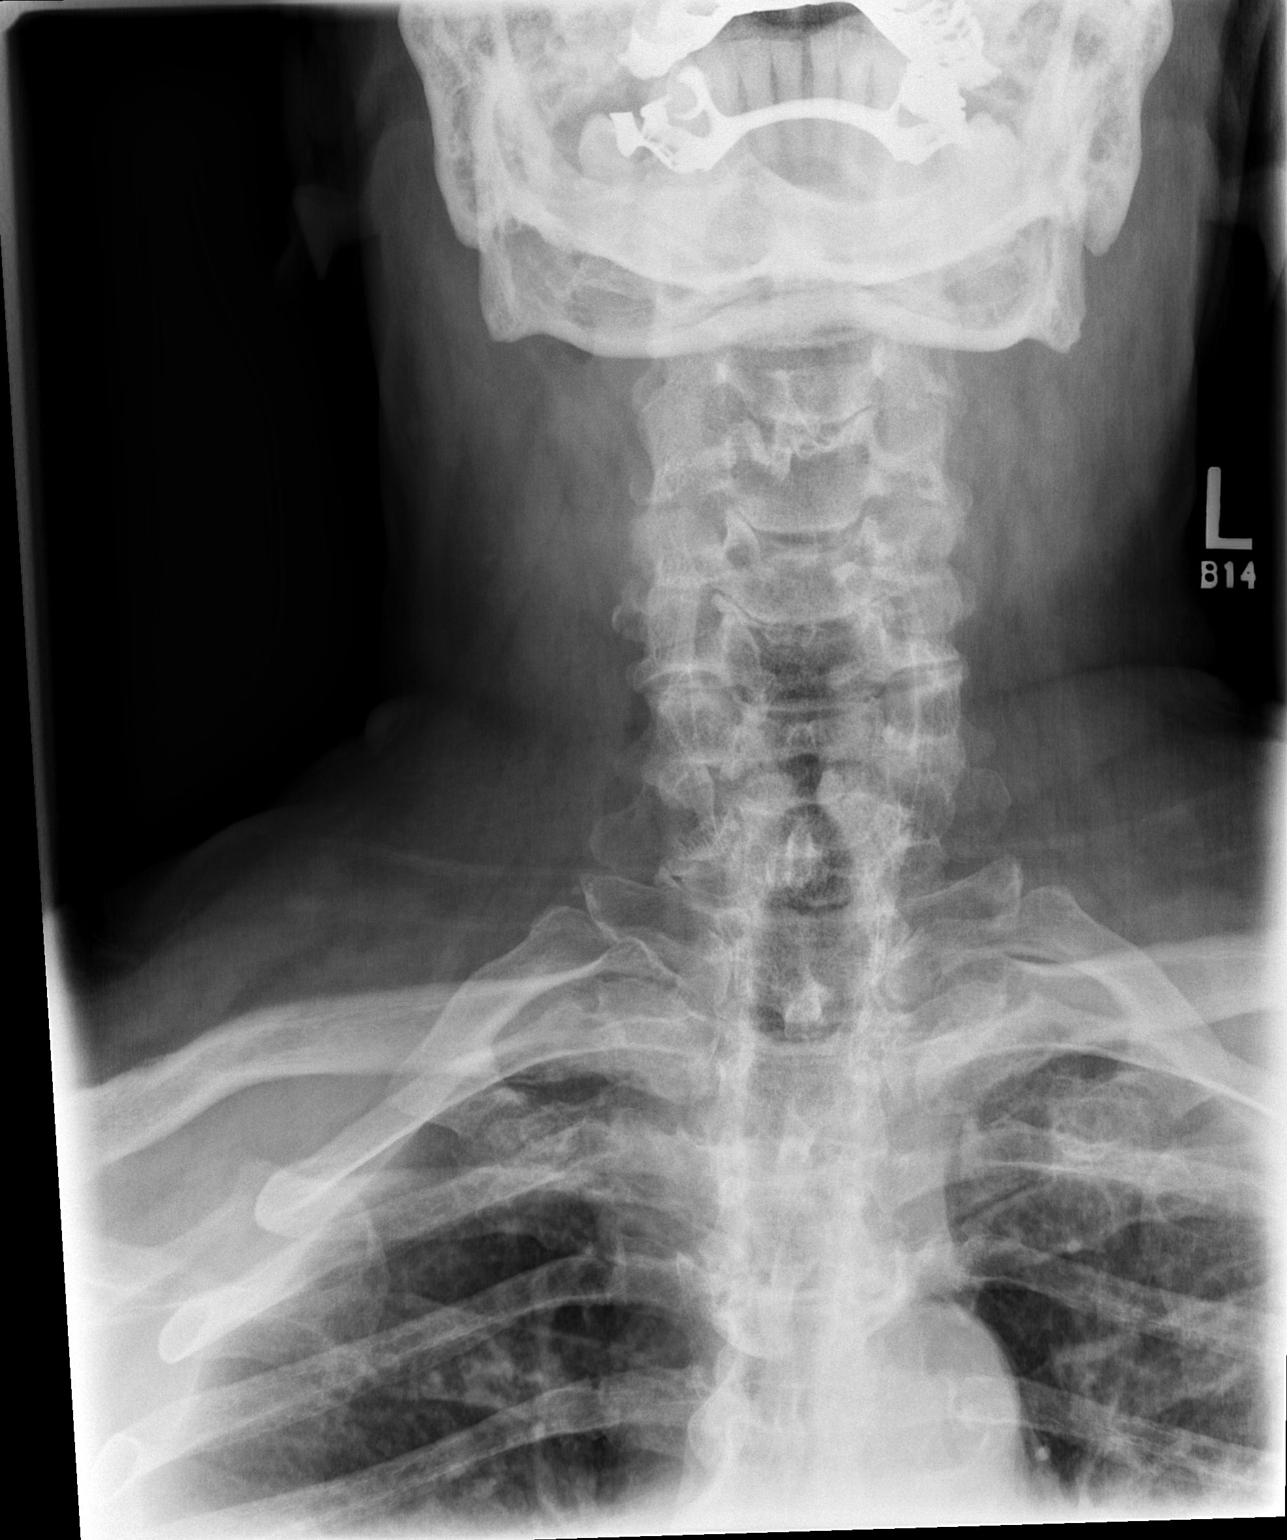

[w c-spine odontoid *]
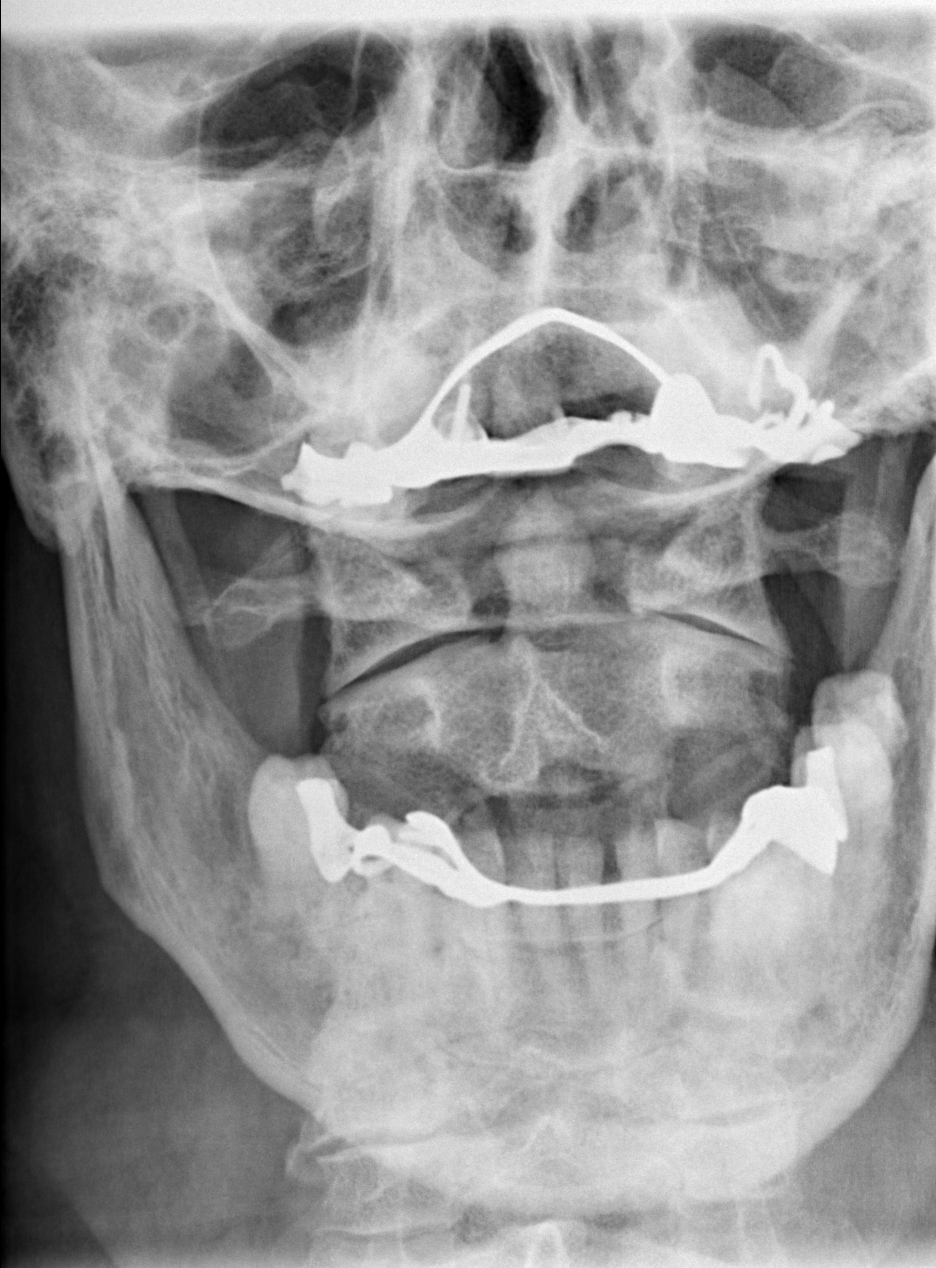

[3 of 3 positions shown; findings below may reference images not displayed]

FINDINGS: Diffuse severe multilevel degenerative change cervical spine. No
acute abnormality identified. No evidence of fracture or
dislocation. Pulmonary apices are clear.
IMPRESSION: Diffuse severe multilevel degenerative change cervical spine. No
acute abnormality identified.

## 2019-03-25 DIAGNOSIS — I5032 Chronic diastolic (congestive) heart failure: Secondary | ICD-10-CM | POA: Diagnosis not present

## 2019-03-25 DIAGNOSIS — I10 Essential (primary) hypertension: Secondary | ICD-10-CM | POA: Diagnosis not present

## 2019-03-25 DIAGNOSIS — M179 Osteoarthritis of knee, unspecified: Secondary | ICD-10-CM | POA: Diagnosis not present

## 2019-03-25 DIAGNOSIS — E1165 Type 2 diabetes mellitus with hyperglycemia: Secondary | ICD-10-CM | POA: Diagnosis not present

## 2019-03-25 DIAGNOSIS — F325 Major depressive disorder, single episode, in full remission: Secondary | ICD-10-CM | POA: Diagnosis not present

## 2019-03-25 DIAGNOSIS — E1169 Type 2 diabetes mellitus with other specified complication: Secondary | ICD-10-CM | POA: Diagnosis not present

## 2019-03-25 DIAGNOSIS — I2510 Atherosclerotic heart disease of native coronary artery without angina pectoris: Secondary | ICD-10-CM | POA: Diagnosis not present

## 2019-04-19 ENCOUNTER — Ambulatory Visit: Payer: Commercial Managed Care - HMO | Admitting: Sports Medicine

## 2019-04-19 ENCOUNTER — Other Ambulatory Visit: Payer: Self-pay

## 2019-04-19 ENCOUNTER — Ambulatory Visit: Payer: Medicare HMO | Admitting: Sports Medicine

## 2019-04-19 DIAGNOSIS — E538 Deficiency of other specified B group vitamins: Secondary | ICD-10-CM | POA: Diagnosis not present

## 2019-04-25 DIAGNOSIS — Z6841 Body Mass Index (BMI) 40.0 and over, adult: Secondary | ICD-10-CM | POA: Diagnosis not present

## 2019-04-25 DIAGNOSIS — E1169 Type 2 diabetes mellitus with other specified complication: Secondary | ICD-10-CM | POA: Diagnosis not present

## 2019-04-25 DIAGNOSIS — I10 Essential (primary) hypertension: Secondary | ICD-10-CM | POA: Diagnosis not present

## 2019-04-25 DIAGNOSIS — F325 Major depressive disorder, single episode, in full remission: Secondary | ICD-10-CM | POA: Diagnosis not present

## 2019-04-25 DIAGNOSIS — I2510 Atherosclerotic heart disease of native coronary artery without angina pectoris: Secondary | ICD-10-CM | POA: Diagnosis not present

## 2019-04-25 DIAGNOSIS — I5032 Chronic diastolic (congestive) heart failure: Secondary | ICD-10-CM | POA: Diagnosis not present

## 2019-04-25 DIAGNOSIS — I700 Atherosclerosis of aorta: Secondary | ICD-10-CM | POA: Diagnosis not present

## 2019-04-26 ENCOUNTER — Ambulatory Visit: Payer: Medicare HMO | Admitting: Podiatry

## 2019-04-26 ENCOUNTER — Other Ambulatory Visit: Payer: Self-pay

## 2019-04-26 ENCOUNTER — Ambulatory Visit: Payer: Medicare HMO

## 2019-04-26 ENCOUNTER — Telehealth: Payer: Self-pay | Admitting: *Deleted

## 2019-04-26 DIAGNOSIS — M79671 Pain in right foot: Secondary | ICD-10-CM

## 2019-04-26 DIAGNOSIS — L03031 Cellulitis of right toe: Secondary | ICD-10-CM | POA: Diagnosis not present

## 2019-04-26 DIAGNOSIS — R0989 Other specified symptoms and signs involving the circulatory and respiratory systems: Secondary | ICD-10-CM

## 2019-04-26 DIAGNOSIS — L02611 Cutaneous abscess of right foot: Secondary | ICD-10-CM | POA: Diagnosis not present

## 2019-04-26 MED ORDER — DOXYCYCLINE HYCLATE 100 MG PO TABS: 100.0000 mg | ORAL_TABLET | Freq: Two times a day (BID) | ORAL | 0 refills | Status: DC

## 2019-04-26 MED ORDER — MUPIROCIN 2 % EX OINT: 1.0000 "application " | TOPICAL_OINTMENT | Freq: Two times a day (BID) | CUTANEOUS | 0 refills | Status: DC

## 2019-04-26 NOTE — Telephone Encounter (Signed)
-----   Message from Trula Slade, DPM sent at 04/26/2019 10:44 AM EDT ----- Can you please order arterial studies? He sees Dr. Tamala Julian at [facility] on [address] it seems. Can you order it for that office since they are familiar with it? Thanks.

## 2019-04-26 NOTE — Telephone Encounter (Signed)
Faxed orders to [facility]. 

## 2019-04-27 NOTE — Progress Notes (Signed)
Subjective: 85 year old male presents the office today with his wife for concerns of his right big toenail.  He states it is odd looking and started become loose and discolored.  He states that he previously had a part of the nail removed.  The nails become thickened more recently.  He does not see any drainage or pus. Denies any systemic complaints such as fevers, chills, nausea, vomiting. No acute changes since last appointment, and no other complaints at this time.   Last A1c was 10.6.  Objective: AAO x3, NAD DP/PT pulses 1/4 on the right and 2/4 in the left bilaterally, CRT less than 3 seconds The right hallux toenail significantly hypertrophic, dystrophic with yellow-brown discoloration.  The nail is also started to become loose.  Examination with a nail removal left> toenail there was purulence underneath the toenail and a granular wound was easily identified.  The nail came off almost in total.  Underlying skin appeared to be intact otherwise. No open lesions or pre-ulcerative lesions.  No pain with calf compression, swelling, warmth, erythema  Assessment: Right hallux toenail abscess  Plan: -All treatment options discussed with the patient including all alternatives, risks, complications.  -I would order arterial studies given decreased pulses.-I debrided the toenail to reveal underlying purulence as well as ulceration.  The nail was almost removed in toto.  The area was cleaned.  Recommend small amount of antibiotic ointment dressing changes daily.  Prescribed mupirocin ointment as well as doxycycline.  Offloading at all times. -Monitor for any clinical signs or symptoms of infection and directed to call the office immediately should any occur or go to the ER. -Patient encouraged to call the office with any questions, concerns, change in symptoms.   Return in about 1 week (around 05/03/2019).  Trula Slade DPM

## 2019-04-29 LAB — WOUND CULTURE
MICRO NUMBER:: 883911
SPECIMEN QUALITY:: ADEQUATE

## 2019-05-02 ENCOUNTER — Ambulatory Visit (HOSPITAL_COMMUNITY)
Admission: RE | Admit: 2019-05-02 | Discharge: 2019-05-02 | Disposition: A | Discharge status: Home / Self Care | Payer: Medicare HMO | Source: Ambulatory Visit | Attending: Cardiology | Admitting: Cardiology

## 2019-05-02 ENCOUNTER — Other Ambulatory Visit: Payer: Self-pay

## 2019-05-02 DIAGNOSIS — R0989 Other specified symptoms and signs involving the circulatory and respiratory systems: Secondary | ICD-10-CM | POA: Diagnosis not present

## 2019-05-03 ENCOUNTER — Encounter: Payer: Self-pay | Admitting: Podiatry

## 2019-05-03 ENCOUNTER — Ambulatory Visit (INDEPENDENT_AMBULATORY_CARE_PROVIDER_SITE_OTHER): Payer: Medicare HMO | Admitting: Podiatry

## 2019-05-03 VITALS — Temp 97.8°F

## 2019-05-03 DIAGNOSIS — L03031 Cellulitis of right toe: Secondary | ICD-10-CM | POA: Diagnosis not present

## 2019-05-03 DIAGNOSIS — L02611 Cutaneous abscess of right foot: Secondary | ICD-10-CM

## 2019-05-03 NOTE — Patient Instructions (Signed)
Diabetes Mellitus and Foot Care Foot care is an important part of your health, especially when you have diabetes. Diabetes may cause you to have problems because of poor blood flow (circulation) to your feet and legs, which can cause your skin to:  Become thinner and drier.  Break more easily.  Heal more slowly.  Peel and crack. You may also have nerve damage (neuropathy) in your legs and feet, causing decreased feeling in them. This means that you may not notice minor injuries to your feet that could lead to more serious problems. Noticing and addressing any potential problems early is the best way to prevent future foot problems. How to care for your feet Foot hygiene  Wash your feet daily with warm water and mild soap. Do not use hot water. Then, pat your feet and the areas between your toes until they are completely dry. Do not soak your feet as this can dry your skin.  Trim your toenails straight across. Do not dig under them or around the cuticle. File the edges of your nails with an emery board or nail file.  Apply a moisturizing lotion or petroleum jelly to the skin on your feet and to dry, brittle toenails. Use lotion that does not contain alcohol and is unscented. Do not apply lotion between your toes. Shoes and socks  Wear clean socks or stockings every day. Make sure they are not too tight. Do not wear knee-high stockings since they may decrease blood flow to your legs.  Wear shoes that fit properly and have enough cushioning. Always look in your shoes before you put them on to be sure there are no objects inside.  To break in new shoes, wear them for just a few hours a day. This prevents injuries on your feet. Wounds, scrapes, corns, and calluses  Check your feet daily for blisters, cuts, bruises, sores, and redness. If you cannot see the bottom of your feet, use a mirror or ask someone for help.  Do not cut corns or calluses or try to remove them with medicine.  If you  find a minor scrape, cut, or break in the skin on your feet, keep it and the skin around it clean and dry. You may clean these areas with mild soap and water. Do not clean the area with peroxide, alcohol, or iodine.  If you have a wound, scrape, corn, or callus on your foot, look at it several times a day to make sure it is healing and not infected. Check for: ? Redness, swelling, or pain. ? Fluid or blood. ? Warmth. ? Pus or a bad smell. General instructions  Do not cross your legs. This may decrease blood flow to your feet.  Do not use heating pads or hot water bottles on your feet. They may burn your skin. If you have lost feeling in your feet or legs, you may not know this is happening until it is too late.  Protect your feet from hot and cold by wearing shoes, such as at the beach or on hot pavement.  Schedule a complete foot exam at least once a year (annually) or more often if you have foot problems. If you have foot problems, report any cuts, sores, or bruises to your health care provider immediately. Contact a health care provider if:  You have a medical condition that increases your risk of infection and you have any cuts, sores, or bruises on your feet.  You have an injury that is not   healing.  You have redness on your legs or feet.  You feel burning or tingling in your legs or feet.  You have pain or cramps in your legs and feet.  Your legs or feet are numb.  Your feet always feel cold.  You have pain around a toenail. Get help right away if:  You have a wound, scrape, corn, or callus on your foot and: ? You have pain, swelling, or redness that gets worse. ? You have fluid or blood coming from the wound, scrape, corn, or callus. ? Your wound, scrape, corn, or callus feels warm to the touch. ? You have pus or a bad smell coming from the wound, scrape, corn, or callus. ? You have a fever. ? You have a red line going up your leg. Summary  Check your feet every day  for cuts, sores, red spots, swelling, and blisters.  Moisturize feet and legs daily.  Wear shoes that fit properly and have enough cushioning.  If you have foot problems, report any cuts, sores, or bruises to your health care provider immediately.  Schedule a complete foot exam at least once a year (annually) or more often if you have foot problems. This information is not intended to replace advice given to you by your health care provider. Make sure you discuss any questions you have with your health care provider. Document Released: 07/25/2000 Document Revised: 09/09/2017 Document Reviewed: 08/29/2016 Elsevier Patient Education  2020 Elsevier Inc.  

## 2019-05-10 DIAGNOSIS — E1169 Type 2 diabetes mellitus with other specified complication: Secondary | ICD-10-CM | POA: Diagnosis not present

## 2019-05-10 DIAGNOSIS — I5032 Chronic diastolic (congestive) heart failure: Secondary | ICD-10-CM | POA: Diagnosis not present

## 2019-05-10 DIAGNOSIS — M179 Osteoarthritis of knee, unspecified: Secondary | ICD-10-CM | POA: Diagnosis not present

## 2019-05-10 DIAGNOSIS — I10 Essential (primary) hypertension: Secondary | ICD-10-CM | POA: Diagnosis not present

## 2019-05-10 DIAGNOSIS — I2510 Atherosclerotic heart disease of native coronary artery without angina pectoris: Secondary | ICD-10-CM | POA: Diagnosis not present

## 2019-05-10 DIAGNOSIS — F325 Major depressive disorder, single episode, in full remission: Secondary | ICD-10-CM | POA: Diagnosis not present

## 2019-05-10 DIAGNOSIS — E1165 Type 2 diabetes mellitus with hyperglycemia: Secondary | ICD-10-CM | POA: Diagnosis not present

## 2019-05-10 NOTE — Progress Notes (Signed)
Subjective: 85 year old male presents the office today for evaluation of infection to his right big toenail.  He states he is doing well he is not seeing any drainage or pus.  Some antibiotics.  Is been keeping antibiotic ointment on the area as well.  No swelling redness or any red streaks.Denies any systemic complaints such as fevers, chills, nausea, vomiting. No acute changes since last appointment, and no other complaints at this time.   Objective: AAO x3, NAD DP/PT pulses palpable bilaterally, CRT less than 3 seconds Nailbed on the right hallux is intact.  No open lesion identified there is no drainage or pus.  No edema, erythema, ascending cellulitis.  Fluctuation capitation.  Area of concern is healed.  No pain with calf compression, swelling, warmth, erythema  Assessment: Healed wound right hallux  Plan: -All treatment options discussed with the patient including all alternatives, risks, complications.  -Reviewed the wound culture with him as well as arterial studies.  At this time the area of his be healed.  Continue offloading.  Monitor for any signs or symptoms of recurrence of infection. -Patient encouraged to call the office with any questions, concerns, change in symptoms.   Trula Slade DPM

## 2019-06-04 DIAGNOSIS — Z23 Encounter for immunization: Secondary | ICD-10-CM | POA: Diagnosis not present

## 2019-06-10 DIAGNOSIS — I2510 Atherosclerotic heart disease of native coronary artery without angina pectoris: Secondary | ICD-10-CM | POA: Diagnosis not present

## 2019-06-10 DIAGNOSIS — E1169 Type 2 diabetes mellitus with other specified complication: Secondary | ICD-10-CM | POA: Diagnosis not present

## 2019-06-10 DIAGNOSIS — Z7984 Long term (current) use of oral hypoglycemic drugs: Secondary | ICD-10-CM | POA: Diagnosis not present

## 2019-06-10 DIAGNOSIS — E1165 Type 2 diabetes mellitus with hyperglycemia: Secondary | ICD-10-CM | POA: Diagnosis not present

## 2019-06-10 DIAGNOSIS — I10 Essential (primary) hypertension: Secondary | ICD-10-CM | POA: Diagnosis not present

## 2019-06-10 DIAGNOSIS — M179 Osteoarthritis of knee, unspecified: Secondary | ICD-10-CM | POA: Diagnosis not present

## 2019-06-10 DIAGNOSIS — I5032 Chronic diastolic (congestive) heart failure: Secondary | ICD-10-CM | POA: Diagnosis not present

## 2019-06-10 DIAGNOSIS — F325 Major depressive disorder, single episode, in full remission: Secondary | ICD-10-CM | POA: Diagnosis not present

## 2019-06-10 DIAGNOSIS — E7800 Pure hypercholesterolemia, unspecified: Secondary | ICD-10-CM | POA: Diagnosis not present

## 2019-07-12 DIAGNOSIS — E1165 Type 2 diabetes mellitus with hyperglycemia: Secondary | ICD-10-CM | POA: Diagnosis not present

## 2019-08-11 DIAGNOSIS — I10 Essential (primary) hypertension: Secondary | ICD-10-CM | POA: Diagnosis not present

## 2019-08-11 DIAGNOSIS — E7800 Pure hypercholesterolemia, unspecified: Secondary | ICD-10-CM | POA: Diagnosis not present

## 2019-08-11 DIAGNOSIS — E1165 Type 2 diabetes mellitus with hyperglycemia: Secondary | ICD-10-CM | POA: Diagnosis not present

## 2019-08-11 DIAGNOSIS — E1169 Type 2 diabetes mellitus with other specified complication: Secondary | ICD-10-CM | POA: Diagnosis not present

## 2019-08-11 DIAGNOSIS — I2510 Atherosclerotic heart disease of native coronary artery without angina pectoris: Secondary | ICD-10-CM | POA: Diagnosis not present

## 2019-08-11 DIAGNOSIS — I5032 Chronic diastolic (congestive) heart failure: Secondary | ICD-10-CM | POA: Diagnosis not present

## 2019-08-11 DIAGNOSIS — F325 Major depressive disorder, single episode, in full remission: Secondary | ICD-10-CM | POA: Diagnosis not present

## 2019-08-11 DIAGNOSIS — M179 Osteoarthritis of knee, unspecified: Secondary | ICD-10-CM | POA: Diagnosis not present

## 2019-08-29 DIAGNOSIS — E1169 Type 2 diabetes mellitus with other specified complication: Secondary | ICD-10-CM | POA: Diagnosis not present

## 2019-08-29 DIAGNOSIS — Z794 Long term (current) use of insulin: Secondary | ICD-10-CM | POA: Diagnosis not present

## 2019-08-29 DIAGNOSIS — I5032 Chronic diastolic (congestive) heart failure: Secondary | ICD-10-CM | POA: Diagnosis not present

## 2019-08-29 DIAGNOSIS — I2510 Atherosclerotic heart disease of native coronary artery without angina pectoris: Secondary | ICD-10-CM | POA: Diagnosis not present

## 2019-08-29 DIAGNOSIS — G4733 Obstructive sleep apnea (adult) (pediatric): Secondary | ICD-10-CM | POA: Diagnosis not present

## 2019-08-29 DIAGNOSIS — I10 Essential (primary) hypertension: Secondary | ICD-10-CM | POA: Diagnosis not present

## 2019-08-31 DIAGNOSIS — E1169 Type 2 diabetes mellitus with other specified complication: Secondary | ICD-10-CM | POA: Diagnosis not present

## 2019-09-09 ENCOUNTER — Ambulatory Visit: Payer: Medicare HMO

## 2019-09-13 ENCOUNTER — Telehealth: Payer: Self-pay

## 2019-09-13 NOTE — Telephone Encounter (Signed)
Pt wife returned call regarding rescheduling her covid vaccine. She and her husband had their first moderna vaccination at [facility] in [place] because they will be moving their shortly.  Neither will need an appointment.  Joycelyn Rua Hopkins

## 2019-10-24 DIAGNOSIS — E1165 Type 2 diabetes mellitus with hyperglycemia: Secondary | ICD-10-CM | POA: Diagnosis not present

## 2019-10-24 DIAGNOSIS — E7800 Pure hypercholesterolemia, unspecified: Secondary | ICD-10-CM | POA: Diagnosis not present

## 2019-10-24 DIAGNOSIS — M179 Osteoarthritis of knee, unspecified: Secondary | ICD-10-CM | POA: Diagnosis not present

## 2019-10-24 DIAGNOSIS — F325 Major depressive disorder, single episode, in full remission: Secondary | ICD-10-CM | POA: Diagnosis not present

## 2019-10-24 DIAGNOSIS — E1169 Type 2 diabetes mellitus with other specified complication: Secondary | ICD-10-CM | POA: Diagnosis not present

## 2019-10-24 DIAGNOSIS — I2510 Atherosclerotic heart disease of native coronary artery without angina pectoris: Secondary | ICD-10-CM | POA: Diagnosis not present

## 2019-10-24 DIAGNOSIS — I10 Essential (primary) hypertension: Secondary | ICD-10-CM | POA: Diagnosis not present

## 2019-10-24 DIAGNOSIS — I5032 Chronic diastolic (congestive) heart failure: Secondary | ICD-10-CM | POA: Diagnosis not present

## 2019-10-31 ENCOUNTER — Other Ambulatory Visit: Payer: Self-pay

## 2019-10-31 ENCOUNTER — Ambulatory Visit: Payer: Medicare HMO | Admitting: Podiatry

## 2019-10-31 DIAGNOSIS — E119 Type 2 diabetes mellitus without complications: Secondary | ICD-10-CM

## 2019-10-31 DIAGNOSIS — L600 Ingrowing nail: Secondary | ICD-10-CM

## 2019-10-31 DIAGNOSIS — B351 Tinea unguium: Secondary | ICD-10-CM

## 2019-10-31 NOTE — Patient Instructions (Signed)
Diabetes Mellitus and Foot Care Foot care is an important part of your health, especially when you have diabetes. Diabetes may cause you to have problems because of poor blood flow (circulation) to your feet and legs, which can cause your skin to:  Become thinner and drier.  Break more easily.  Heal more slowly.  Peel and crack. You may also have nerve damage (neuropathy) in your legs and feet, causing decreased feeling in them. This means that you may not notice minor injuries to your feet that could lead to more serious problems. Noticing and addressing any potential problems early is the best way to prevent future foot problems. How to care for your feet Foot hygiene  Wash your feet daily with warm water and mild soap. Do not use hot water. Then, pat your feet and the areas between your toes until they are completely dry. Do not soak your feet as this can dry your skin.  Trim your toenails straight across. Do not dig under them or around the cuticle. File the edges of your nails with an emery board or nail file.  Apply a moisturizing lotion or petroleum jelly to the skin on your feet and to dry, brittle toenails. Use lotion that does not contain alcohol and is unscented. Do not apply lotion between your toes. Shoes and socks  Wear clean socks or stockings every day. Make sure they are not too tight. Do not wear knee-high stockings since they may decrease blood flow to your legs.  Wear shoes that fit properly and have enough cushioning. Always look in your shoes before you put them on to be sure there are no objects inside.  To break in new shoes, wear them for just a few hours a day. This prevents injuries on your feet. Wounds, scrapes, corns, and calluses  Check your feet daily for blisters, cuts, bruises, sores, and redness. If you cannot see the bottom of your feet, use a mirror or ask someone for help.  Do not cut corns or calluses or try to remove them with medicine.  If you  find a minor scrape, cut, or break in the skin on your feet, keep it and the skin around it clean and dry. You may clean these areas with mild soap and water. Do not clean the area with peroxide, alcohol, or iodine.  If you have a wound, scrape, corn, or callus on your foot, look at it several times a day to make sure it is healing and not infected. Check for: ? Redness, swelling, or pain. ? Fluid or blood. ? Warmth. ? Pus or a bad smell. General instructions  Do not cross your legs. This may decrease blood flow to your feet.  Do not use heating pads or hot water bottles on your feet. They may burn your skin. If you have lost feeling in your feet or legs, you may not know this is happening until it is too late.  Protect your feet from hot and cold by wearing shoes, such as at the beach or on hot pavement.  Schedule a complete foot exam at least once a year (annually) or more often if you have foot problems. If you have foot problems, report any cuts, sores, or bruises to your health care provider immediately. Contact a health care provider if:  You have a medical condition that increases your risk of infection and you have any cuts, sores, or bruises on your feet.  You have an injury that is not   healing.  You have redness on your legs or feet.  You feel burning or tingling in your legs or feet.  You have pain or cramps in your legs and feet.  Your legs or feet are numb.  Your feet always feel cold.  You have pain around a toenail. Get help right away if:  You have a wound, scrape, corn, or callus on your foot and: ? You have pain, swelling, or redness that gets worse. ? You have fluid or blood coming from the wound, scrape, corn, or callus. ? Your wound, scrape, corn, or callus feels warm to the touch. ? You have pus or a bad smell coming from the wound, scrape, corn, or callus. ? You have a fever. ? You have a red line going up your leg. Summary  Check your feet every day  for cuts, sores, red spots, swelling, and blisters.  Moisturize feet and legs daily.  Wear shoes that fit properly and have enough cushioning.  If you have foot problems, report any cuts, sores, or bruises to your health care provider immediately.  Schedule a complete foot exam at least once a year (annually) or more often if you have foot problems. This information is not intended to replace advice given to you by your health care provider. Make sure you discuss any questions you have with your health care provider. Document Revised: 04/20/2019 Document Reviewed: 08/29/2016 Elsevier Patient Education  2020 Elsevier Inc.  

## 2019-11-06 NOTE — Progress Notes (Signed)
Subjective: 86 year old male presents the office today for follow-up evaluation after he had a previous infection of the right big toenail.  Currently denies any pain and denies any open sores and denies any drainage or pus.  He is worried the toenail itself is not healing or growing out correctly.  The toenail has become somewhat thicker. Denies any systemic complaints such as fevers, chills, nausea, vomiting. No acute changes since last appointment, and no other complaints at this time.   Objective: AAO x3, NAD DP/PT pulses palpable bilaterally, CRT less than 3 seconds Sensation intact with Semmes Weinstein monofilament Mild incurvation present to the right hallux toenail and nail slightly hypertrophic, dystrophic.  There is no pain in the nail currently there is no redness or drainage or any signs of infection.  There is no open lesions. No open lesions or pre-ulcerative lesions.  No pain with calf compression, swelling, warmth, erythema  Assessment: Ingrown toenail right hallux  Plan: -All treatment options discussed with the patient including all alternatives, risks, complications.  -I debrided the nails without any complications or bleeding today as a courtesy.  Overall she is been doing well there is no skin breakdown.  Nail itself is mildly hypertrophic and we discussed treatment options for this.  Discussed importance of daily foot inspection.  Discussed importance of glucose control. -Patient encouraged to call the office with any questions, concerns, change in symptoms.   Return in about 1 year (around 10/30/2020) for diabetic foot exam .   Vivi Barrack DPM

## 2019-11-30 DIAGNOSIS — E1165 Type 2 diabetes mellitus with hyperglycemia: Secondary | ICD-10-CM | POA: Diagnosis not present

## 2019-12-02 ENCOUNTER — Ambulatory Visit: Payer: Medicare HMO | Admitting: Cardiology

## 2019-12-02 DIAGNOSIS — I5032 Chronic diastolic (congestive) heart failure: Secondary | ICD-10-CM | POA: Diagnosis not present

## 2019-12-02 DIAGNOSIS — I10 Essential (primary) hypertension: Secondary | ICD-10-CM | POA: Diagnosis not present

## 2019-12-02 DIAGNOSIS — M179 Osteoarthritis of knee, unspecified: Secondary | ICD-10-CM | POA: Diagnosis not present

## 2019-12-02 DIAGNOSIS — F325 Major depressive disorder, single episode, in full remission: Secondary | ICD-10-CM | POA: Diagnosis not present

## 2019-12-02 DIAGNOSIS — I2510 Atherosclerotic heart disease of native coronary artery without angina pectoris: Secondary | ICD-10-CM | POA: Diagnosis not present

## 2019-12-02 DIAGNOSIS — E1169 Type 2 diabetes mellitus with other specified complication: Secondary | ICD-10-CM | POA: Diagnosis not present

## 2019-12-02 DIAGNOSIS — E7800 Pure hypercholesterolemia, unspecified: Secondary | ICD-10-CM | POA: Diagnosis not present

## 2019-12-02 DIAGNOSIS — E1165 Type 2 diabetes mellitus with hyperglycemia: Secondary | ICD-10-CM | POA: Diagnosis not present

## 2019-12-23 DIAGNOSIS — Z1389 Encounter for screening for other disorder: Secondary | ICD-10-CM | POA: Diagnosis not present

## 2019-12-23 DIAGNOSIS — F325 Major depressive disorder, single episode, in full remission: Secondary | ICD-10-CM | POA: Diagnosis not present

## 2019-12-23 DIAGNOSIS — N182 Chronic kidney disease, stage 2 (mild): Secondary | ICD-10-CM | POA: Diagnosis not present

## 2019-12-23 DIAGNOSIS — Z0000 Encounter for general adult medical examination without abnormal findings: Secondary | ICD-10-CM | POA: Diagnosis not present

## 2019-12-23 DIAGNOSIS — I700 Atherosclerosis of aorta: Secondary | ICD-10-CM | POA: Diagnosis not present

## 2019-12-23 DIAGNOSIS — I10 Essential (primary) hypertension: Secondary | ICD-10-CM | POA: Diagnosis not present

## 2019-12-23 DIAGNOSIS — I5032 Chronic diastolic (congestive) heart failure: Secondary | ICD-10-CM | POA: Diagnosis not present

## 2019-12-23 DIAGNOSIS — E7800 Pure hypercholesterolemia, unspecified: Secondary | ICD-10-CM | POA: Diagnosis not present

## 2019-12-23 DIAGNOSIS — I2510 Atherosclerotic heart disease of native coronary artery without angina pectoris: Secondary | ICD-10-CM | POA: Diagnosis not present

## 2019-12-23 DIAGNOSIS — Z794 Long term (current) use of insulin: Secondary | ICD-10-CM | POA: Diagnosis not present

## 2019-12-23 DIAGNOSIS — G4733 Obstructive sleep apnea (adult) (pediatric): Secondary | ICD-10-CM | POA: Diagnosis not present

## 2019-12-23 DIAGNOSIS — E1121 Type 2 diabetes mellitus with diabetic nephropathy: Secondary | ICD-10-CM | POA: Diagnosis not present

## 2019-12-23 DIAGNOSIS — E1140 Type 2 diabetes mellitus with diabetic neuropathy, unspecified: Secondary | ICD-10-CM | POA: Diagnosis not present

## 2020-01-24 DIAGNOSIS — H524 Presbyopia: Secondary | ICD-10-CM | POA: Diagnosis not present

## 2020-01-24 DIAGNOSIS — Z961 Presence of intraocular lens: Secondary | ICD-10-CM | POA: Diagnosis not present

## 2020-01-24 DIAGNOSIS — E119 Type 2 diabetes mellitus without complications: Secondary | ICD-10-CM | POA: Diagnosis not present

## 2020-02-05 NOTE — Progress Notes (Signed)
Cardiology Office Note:    Date:  02/06/2020   ID:  Charles Bonilla, DOB 1934/01/03, MRN [id number]  PCP:  Lavone Orn, MD  Cardiologist:  Sinclair Grooms, MD   Referring MD: Lavone Orn, MD   Chief Complaint  Patient presents with  . Coronary Artery Disease  . Obesity    History of Present Illness:    Charles Bonilla is a 86 y.o. male with a hx of bare-metal stent(01/2002), hypertension, hyperlipidemia, OSA,  and diabetes mellitus II.  Largely sedentary since onset of the pandemic.  Previously exercised on a regular basis with walking.  Has not resumed physical activity despite vaccination.  He has no cardiac symptoms.  No medication side effects.  He is not following a heart healthy diet.  Past Medical History:  Diagnosis Date  . CAD (coronary artery disease)     PTCA and stent March 2003  . Coronary atherosclerosis of unspecified type of vessel, native or graft   . Depressed bipolar disorder ([place])   . DJD (degenerative joint disease)    Dr. Berenice Primas  . Erectile dysfunction   . HTN (hypertension)   . Hyperlipidemia   . OSA (obstructive sleep apnea)   . Other testicular hypofunction     Past Surgical History:  Procedure Laterality Date  . L knee replacement, [name]    . R knee replacement, [place]      Current Medications: Current Meds  Medication Sig  . amLODipine (NORVASC) 5 MG tablet Take 5 mg by mouth daily.  Marland Kitchen aspirin EC 81 MG tablet Take 81 mg by mouth daily.  . Cholecalciferol (VITAMIN D PO) Take 1 tablet by mouth daily. daily  . DROPLET PEN NEEDLES 32G X 6 MM MISC   . escitalopram (LEXAPRO) 20 MG tablet Take 20 mg by mouth daily.  . metFORMIN (GLUCOPHAGE) 500 MG tablet Take 1,000 mg by mouth 2 (two) times daily with a meal.   . mupirocin ointment (BACTROBAN) 2 % Apply 1 application topically 2 (two) times daily.  Marland Kitchen NITROSTAT 0.4 MG SL tablet PLACE 1 TABLET UNDER THE TONGUE EVERY 5 MINUTES FOR CHEST PAIN,UP TO 3 TABLETS  . simvastatin (ZOCOR) 20 MG  tablet Take 20 mg by mouth daily at 6 PM.   . SOLIQUA 100-33 UNT-MCG/ML SOPN Inject 45 Units into the skin daily.   Marland Kitchen spironolactone (ALDACTONE) 25 MG tablet Take 25 mg by mouth daily.     Allergies:   Patient has no known allergies.   Social History   Socioeconomic History  . Marital status: Married    Spouse name: Not on file  . Number of children: Not on file  . Years of education: Not on file  . Highest education level: Not on file  Occupational History  . Not on file  Tobacco Use  . Smoking status: Former Research scientist (life sciences)  . Smokeless tobacco: Never Used  . Tobacco comment: QUIT 83YRS AGO  Substance and Sexual Activity  . Alcohol use: Yes    Comment: EVERY DAY WINE  . Drug use: No  . Sexual activity: Not on file  Other Topics Concern  . Not on file  Social History Narrative  . Not on file   Social Determinants of Health   Financial Resource Strain:   . Difficulty of Paying Living Expenses:   Food Insecurity:   . Worried About Charity fundraiser in the Last Year:   . Arboriculturist in the Last Year:   Transportation Needs:   .  Lack of Transportation (Medical):   Marland Kitchen Lack of Transportation (Non-Medical):   Physical Activity:   . Days of Exercise per Week:   . Minutes of Exercise per Session:   Stress:   . Feeling of Stress :   Social Connections:   . Frequency of Communication with Friends and Family:   . Frequency of Social Gatherings with Friends and Family:   . Attends Religious Services:   . Active Member of Clubs or Organizations:   . Attends Banker Meetings:   Marland Kitchen Marital Status:      Family History: The patient's family history is not on file.  ROS:   Please see the history of present illness.    No specific complaints.  Says he does not have the incentive or motivation to exercise.  Not following his diet.  All other systems reviewed and are negative.  EKGs/Labs/Other Studies Reviewed:    The following studies were reviewed today:  No  recent imaging.  EKG:  EKG sinus rhythm with right bundle, left anterior hemiblock, and otherwise unremarkable.  Compared to June 2019, no changes noted.  Recent Labs: No results found for requested labs within last 8760 hours.  Recent Lipid Panel No results found for: CHOL, TRIG, HDL, CHOLHDL, VLDL, LDLCALC, LDLDIRECT  Physical Exam:    VS:  BP 122/66   Pulse 66   Ht 5' 10.5" (1.791 m)   Wt (!) 321 lb 12.8 oz (146 kg)   SpO2 96%   BMI 45.52 kg/m     Wt Readings from Last 3 Encounters:  02/06/20 (!) 321 lb 12.8 oz (146 kg)  01/17/19 (!) 302 lb (137 kg)  01/25/18 (!) 313 lb 6.4 oz (142.2 kg)     GEN: Morbidly obese. No acute distress HEENT: Normal NECK: No JVD. LYMPHATICS: No lymphadenopathy CARDIAC:  RRR without murmur, gallop, or edema. VASCULAR:  Normal Pulses. No bruits. RESPIRATORY:  Clear to auscultation without rales, wheezing or rhonchi  ABDOMEN: Soft, non-tender, non-distended, No pulsatile mass, MUSCULOSKELETAL: No deformity  SKIN: Warm and dry NEUROLOGIC:  Alert and oriented x 3 PSYCHIATRIC:  Normal affect   ASSESSMENT:    1. Coronary artery disease involving native coronary artery of native heart without angina pectoris   2. Essential hypertension   3. Other hyperlipidemia   4. Right bundle branch block (RBBB) with left anterior hemiblock   5. Controlled type 2 diabetes mellitus without complication, with long-term current use of insulin (HCC)   6. Educated about COVID-19 virus infection    PLAN:    In order of problems listed above:  1. 20 pound weight gain since last June.  Encouraged aerobic activity and reduction in carbohydrates in diet. 2. Excellent blood pressure control 3. Continue 20 mg of Zocor daily.  Most recent LDL was 62 in May 2021. 4. Unchanged from prior tracings. 5. Hemoglobin A1c is 8. 6. COVID-19 vaccine has been received.  Overall education and awareness concerning secondary risk prevention was discussed in detail: LDL less  than 70, hemoglobin A1c less than 7, blood pressure target less than 130/80 mmHg, >150 minutes of moderate aerobic activity per week, avoidance of smoking, weight control (via diet and exercise), and continued surveillance/management of/for obstructive sleep apnea.  Progressive weight gain is his major problem   Medication Adjustments/Labs and Tests Ordered: Current medicines are reviewed at length with the patient today.  Concerns regarding medicines are outlined above.  Orders Placed This Encounter  Procedures  . EKG 12-Lead   No  orders of the defined types were placed in this encounter.   Patient Instructions  Medication Instructions:  Your physician recommends that you continue on your current medications as directed. Please refer to the Current Medication list given to you today.  *If you need a refill on your cardiac medications before your next appointment, please call your pharmacy*   Lab Work: None If you have labs (blood work) drawn today and your tests are completely normal, you will receive your results only by: Marland Kitchen MyChart Message (if you have MyChart) OR . A paper copy in the mail If you have any lab test that is abnormal or we need to change your treatment, we will call you to review the results.   Testing/Procedures: None   Follow-Up: At [facility], you and your health needs are our priority.  As part of our continuing mission to provide you with exceptional heart care, we have created designated Provider Care Teams.  These Care Teams include your primary Cardiologist (physician) and Advanced Practice Providers (APPs -  Physician Assistants and Nurse Practitioners) who all work together to provide you with the care you need, when you need it.  We recommend signing up for the patient portal called "MyChart".  Sign up information is provided on this After Visit Summary.  MyChart is used to connect with patients for Virtual Visits (Telemedicine).  Patients are able  to view lab/test results, encounter notes, upcoming appointments, etc.  Non-urgent messages can be sent to your provider as well.   To learn more about what you can do with MyChart, go to [email or web address].    Your next appointment:   12 month(s)  The format for your next appointment:   In Person  Provider:   You may see Lesleigh Noe, MD or one of the following Advanced Practice Providers on your designated Care Team:    Norma Fredrickson, NP  Nada Boozer, NP  Georgie Chard, NP    Other Instructions      Signed, Lesleigh Noe, MD  02/06/2020 2:01 PM    [facility]

## 2020-02-06 ENCOUNTER — Ambulatory Visit: Payer: Medicare HMO | Admitting: Interventional Cardiology

## 2020-02-06 ENCOUNTER — Other Ambulatory Visit: Payer: Self-pay

## 2020-02-06 ENCOUNTER — Encounter: Payer: Self-pay | Admitting: Interventional Cardiology

## 2020-02-06 VITALS — BP 122/66 | HR 66 | Ht 70.5 in | Wt 321.8 lb

## 2020-02-06 DIAGNOSIS — E7849 Other hyperlipidemia: Secondary | ICD-10-CM

## 2020-02-06 DIAGNOSIS — Z7189 Other specified counseling: Secondary | ICD-10-CM

## 2020-02-06 DIAGNOSIS — Z794 Long term (current) use of insulin: Secondary | ICD-10-CM

## 2020-02-06 DIAGNOSIS — I452 Bifascicular block: Secondary | ICD-10-CM

## 2020-02-06 DIAGNOSIS — E119 Type 2 diabetes mellitus without complications: Secondary | ICD-10-CM | POA: Diagnosis not present

## 2020-02-06 DIAGNOSIS — I2510 Atherosclerotic heart disease of native coronary artery without angina pectoris: Secondary | ICD-10-CM | POA: Diagnosis not present

## 2020-02-06 DIAGNOSIS — I10 Essential (primary) hypertension: Secondary | ICD-10-CM

## 2020-02-06 NOTE — Patient Instructions (Signed)

## 2020-02-08 DIAGNOSIS — E1121 Type 2 diabetes mellitus with diabetic nephropathy: Secondary | ICD-10-CM | POA: Diagnosis not present

## 2020-02-17 ENCOUNTER — Ambulatory Visit: Payer: Medicare HMO | Admitting: Interventional Cardiology

## 2020-03-06 DIAGNOSIS — M25562 Pain in left knee: Secondary | ICD-10-CM | POA: Diagnosis not present

## 2020-03-06 DIAGNOSIS — Z96651 Presence of right artificial knee joint: Secondary | ICD-10-CM | POA: Diagnosis not present

## 2020-03-06 DIAGNOSIS — M25561 Pain in right knee: Secondary | ICD-10-CM | POA: Diagnosis not present

## 2020-03-06 DIAGNOSIS — Z96652 Presence of left artificial knee joint: Secondary | ICD-10-CM | POA: Diagnosis not present

## 2020-03-06 DIAGNOSIS — Z96653 Presence of artificial knee joint, bilateral: Secondary | ICD-10-CM | POA: Diagnosis not present

## 2020-03-14 DIAGNOSIS — E1165 Type 2 diabetes mellitus with hyperglycemia: Secondary | ICD-10-CM | POA: Diagnosis not present

## 2020-03-14 DIAGNOSIS — E1169 Type 2 diabetes mellitus with other specified complication: Secondary | ICD-10-CM | POA: Diagnosis not present

## 2020-03-14 DIAGNOSIS — E7800 Pure hypercholesterolemia, unspecified: Secondary | ICD-10-CM | POA: Diagnosis not present

## 2020-03-14 DIAGNOSIS — F325 Major depressive disorder, single episode, in full remission: Secondary | ICD-10-CM | POA: Diagnosis not present

## 2020-03-14 DIAGNOSIS — E1140 Type 2 diabetes mellitus with diabetic neuropathy, unspecified: Secondary | ICD-10-CM | POA: Diagnosis not present

## 2020-03-14 DIAGNOSIS — I5032 Chronic diastolic (congestive) heart failure: Secondary | ICD-10-CM | POA: Diagnosis not present

## 2020-03-14 DIAGNOSIS — E1121 Type 2 diabetes mellitus with diabetic nephropathy: Secondary | ICD-10-CM | POA: Diagnosis not present

## 2020-03-14 DIAGNOSIS — M179 Osteoarthritis of knee, unspecified: Secondary | ICD-10-CM | POA: Diagnosis not present

## 2020-03-14 DIAGNOSIS — I2510 Atherosclerotic heart disease of native coronary artery without angina pectoris: Secondary | ICD-10-CM | POA: Diagnosis not present

## 2020-04-03 ENCOUNTER — Encounter: Payer: Self-pay | Admitting: Podiatry

## 2020-04-03 ENCOUNTER — Other Ambulatory Visit: Payer: Self-pay

## 2020-04-03 ENCOUNTER — Ambulatory Visit: Payer: Medicare HMO | Admitting: Podiatry

## 2020-04-03 DIAGNOSIS — E1165 Type 2 diabetes mellitus with hyperglycemia: Secondary | ICD-10-CM

## 2020-04-03 DIAGNOSIS — IMO0002 Reserved for concepts with insufficient information to code with codable children: Secondary | ICD-10-CM

## 2020-04-03 DIAGNOSIS — L601 Onycholysis: Secondary | ICD-10-CM

## 2020-04-03 DIAGNOSIS — L600 Ingrowing nail: Secondary | ICD-10-CM | POA: Diagnosis not present

## 2020-04-03 DIAGNOSIS — E1142 Type 2 diabetes mellitus with diabetic polyneuropathy: Secondary | ICD-10-CM | POA: Diagnosis not present

## 2020-04-03 MED ORDER — DOXYCYCLINE HYCLATE 100 MG PO TABS: 100.0000 mg | ORAL_TABLET | Freq: Two times a day (BID) | ORAL | 0 refills | Status: DC

## 2020-04-03 NOTE — Patient Instructions (Signed)

## 2020-04-09 DIAGNOSIS — E1169 Type 2 diabetes mellitus with other specified complication: Secondary | ICD-10-CM | POA: Diagnosis not present

## 2020-04-09 DIAGNOSIS — Z23 Encounter for immunization: Secondary | ICD-10-CM | POA: Diagnosis not present

## 2020-04-09 DIAGNOSIS — I10 Essential (primary) hypertension: Secondary | ICD-10-CM | POA: Diagnosis not present

## 2020-04-09 NOTE — Progress Notes (Signed)
Subjective: 86 year old male presents the office today for concerns of his right big toenail becoming loose.  He states there is been some clear drainage come from the area over the last 3 weeks but no pus.  No redness or red streaks.  He said no recent treatment.  No other concerns today. Denies any systemic complaints such as fevers, chills, nausea, vomiting. No acute changes since last appointment, and no other complaints at this time.   Last A1c in the chart was 10.6 on September 09, 2019  Objective: AAO x3, NAD DP/PT pulses palpable bilaterally, CRT less than 3 seconds The right hallux toenail is hypertrophic, dystrophic with brown discoloration loosen the underlying nail bed distally but adhered proximally.  There is no purulence identified today and there is faint edema and erythema around the nail but there is no drainage or pus or ascending cellulitis.  There is no fluctuation or crepitation.  There is no open lesions. No pain with calf compression, swelling, warmth, erythema  Assessment: Onycholysis right hallux toenail with localized infection  Plan: -All treatment options discussed with the patient including all alternatives, risks, complications.  -Debride the nails any complications or bleeding.  Prescribed doxycycline.  Antibiotic ointment to the toe daily.  Discussed with him possible removal if needed.  Monitor for any signs or symptoms of worsening infection. -Patient encouraged to call the office with any questions, concerns, change in symptoms.   Vivi Barrack DPM

## 2020-04-10 ENCOUNTER — Telehealth: Payer: Self-pay | Admitting: *Deleted

## 2020-04-10 NOTE — Telephone Encounter (Signed)
Called and spoke with Steward Drone at [facility] [place] and I relayed the message per Dr Ardelle Anton and I did get the request sent to our fax machine. Misty Stanley

## 2020-04-10 NOTE — Telephone Encounter (Signed)
-----   Message from Vivi Barrack, DPM sent at 04/09/2020  7:22 AM EDT ----- Can you call his PCP to see if he has an updated A1c? Thanks.

## 2020-05-01 ENCOUNTER — Ambulatory Visit (INDEPENDENT_AMBULATORY_CARE_PROVIDER_SITE_OTHER): Payer: Medicare HMO | Admitting: Podiatry

## 2020-05-01 ENCOUNTER — Other Ambulatory Visit: Payer: Self-pay

## 2020-05-01 DIAGNOSIS — E1165 Type 2 diabetes mellitus with hyperglycemia: Secondary | ICD-10-CM | POA: Diagnosis not present

## 2020-05-01 DIAGNOSIS — E1142 Type 2 diabetes mellitus with diabetic polyneuropathy: Secondary | ICD-10-CM | POA: Diagnosis not present

## 2020-05-01 DIAGNOSIS — IMO0002 Reserved for concepts with insufficient information to code with codable children: Secondary | ICD-10-CM

## 2020-05-01 DIAGNOSIS — L601 Onycholysis: Secondary | ICD-10-CM | POA: Diagnosis not present

## 2020-05-02 NOTE — Progress Notes (Signed)
Subjective: 86 year old male presents the office today for follow-up evaluation of right hallux onycholysis with localized infection.  He states the toe has been doing well and said no issues.  No redness or drainage or any signs of infection he reports.  No new concerns today. Denies any systemic complaints such as fevers, chills, nausea, vomiting. No acute changes since last appointment, and no other complaints at this time.   Objective: AAO x3, NAD DP/PT pulses palpable bilaterally, CRT less than 3 seconds Right hallux nails hypertrophic, dystrophic with yellow discoloration.  No edema, erythema any drainage or pus or any signs of infection noted today. No pain with calf compression, swelling, warmth, erythema  Assessment: 86 year old male with right hallux onychodystrophy, onycholysis with resolved infection  Plan: -All treatment options discussed with the patient including all alternatives, risks, complications.  -Debrided the nails any complications or bleeding.  Infection is resolved.  Monitor for any reoccurrence. -Patient encouraged to call the office with any questions, concerns, change in symptoms.   Vivi Barrack DPM

## 2020-05-17 DIAGNOSIS — Z23 Encounter for immunization: Secondary | ICD-10-CM | POA: Diagnosis not present

## 2020-06-21 ENCOUNTER — Other Ambulatory Visit: Payer: Self-pay | Admitting: Internal Medicine

## 2020-06-21 ENCOUNTER — Other Ambulatory Visit (HOSPITAL_COMMUNITY): Payer: Self-pay | Admitting: Internal Medicine

## 2020-06-21 DIAGNOSIS — E1121 Type 2 diabetes mellitus with diabetic nephropathy: Secondary | ICD-10-CM | POA: Diagnosis not present

## 2020-06-21 DIAGNOSIS — R29898 Other symptoms and signs involving the musculoskeletal system: Secondary | ICD-10-CM

## 2020-06-21 DIAGNOSIS — M79605 Pain in left leg: Secondary | ICD-10-CM | POA: Diagnosis not present

## 2020-06-21 DIAGNOSIS — F325 Major depressive disorder, single episode, in full remission: Secondary | ICD-10-CM | POA: Diagnosis not present

## 2020-06-21 DIAGNOSIS — E1165 Type 2 diabetes mellitus with hyperglycemia: Secondary | ICD-10-CM | POA: Diagnosis not present

## 2020-06-21 DIAGNOSIS — E1169 Type 2 diabetes mellitus with other specified complication: Secondary | ICD-10-CM | POA: Diagnosis not present

## 2020-06-21 DIAGNOSIS — I5032 Chronic diastolic (congestive) heart failure: Secondary | ICD-10-CM | POA: Diagnosis not present

## 2020-06-21 DIAGNOSIS — I10 Essential (primary) hypertension: Secondary | ICD-10-CM | POA: Diagnosis not present

## 2020-06-21 DIAGNOSIS — I2510 Atherosclerotic heart disease of native coronary artery without angina pectoris: Secondary | ICD-10-CM | POA: Diagnosis not present

## 2020-06-21 DIAGNOSIS — E7800 Pure hypercholesterolemia, unspecified: Secondary | ICD-10-CM | POA: Diagnosis not present

## 2020-06-21 DIAGNOSIS — M179 Osteoarthritis of knee, unspecified: Secondary | ICD-10-CM | POA: Diagnosis not present

## 2020-06-22 ENCOUNTER — Ambulatory Visit
Admission: RE | Admit: 2020-06-22 | Discharge: 2020-06-22 | Disposition: A | Discharge status: Home / Self Care | Payer: Medicare HMO | Source: Ambulatory Visit | Attending: Internal Medicine | Admitting: Internal Medicine

## 2020-06-22 ENCOUNTER — Other Ambulatory Visit: Payer: Self-pay

## 2020-06-22 DIAGNOSIS — M47817 Spondylosis without myelopathy or radiculopathy, lumbosacral region: Secondary | ICD-10-CM | POA: Diagnosis not present

## 2020-06-22 DIAGNOSIS — R531 Weakness: Secondary | ICD-10-CM | POA: Diagnosis not present

## 2020-06-22 DIAGNOSIS — R29898 Other symptoms and signs involving the musculoskeletal system: Secondary | ICD-10-CM

## 2020-06-22 DIAGNOSIS — M5136 Other intervertebral disc degeneration, lumbar region: Secondary | ICD-10-CM | POA: Diagnosis not present

## 2020-06-22 DIAGNOSIS — R296 Repeated falls: Secondary | ICD-10-CM | POA: Diagnosis not present

## 2020-06-28 ENCOUNTER — Other Ambulatory Visit: Payer: Self-pay | Admitting: Neurosurgery

## 2020-06-28 ENCOUNTER — Encounter (HOSPITAL_COMMUNITY): Payer: Self-pay | Admitting: Neurosurgery

## 2020-06-28 DIAGNOSIS — M5126 Other intervertebral disc displacement, lumbar region: Secondary | ICD-10-CM | POA: Diagnosis not present

## 2020-06-29 ENCOUNTER — Telehealth: Payer: Self-pay

## 2020-06-29 NOTE — Telephone Encounter (Signed)
   Primary Cardiologist: Lesleigh Noe, MD  Chart reviewed as part of pre-operative protocol coverage.   Patient has a history of CAD s/p BMS to unspecified vessel in 2003. Last seen by cardiology at an outpatient visit with Dr. Katrinka Blazing 01/2020, at which time he was without anginal complaints and was recommended to follow-up in 1 year.  Dr. Katrinka Blazing -  Can you comment on recommendations for holding aspirin prior to his upcoming spinal surgery (discectomy)? Please route your response back to P CV DIV PREOP.   Thank you!  Beatriz Stallion, PA-C 06/29/2020, 2:29 PM

## 2020-06-29 NOTE — Telephone Encounter (Signed)
   [facility] Pre-operative Risk Assessment    HEARTCARE STAFF: - Please ensure there is not already an duplicate clearance open for this procedure. - Under Visit Info/Reason for Call, type in Other and utilize the format Clearance MM/DD/YY or Clearance TBD. Do not use dashes or single digits. - If request is for dental extraction, please clarify the # of teeth to be extracted.  Request for surgical clearance:  1. What type of surgery is being performed? LEFT L3-4 DISCECTOMY   2. When is this surgery scheduled? 07/10/20   3. What type of clearance is required (medical clearance vs. Pharmacy clearance to hold med vs. Both)? BOTH  4. Are there any medications that need to be held prior to surgery and how long? ASA   5. Practice name and name of physician performing surgery? [facility]; DR. KYLE CABBELL  6. What is the office phone number? [phone]   7.   What is the office fax number? [phone]  8.   Anesthesia type (None, local, MAC, general) ? NONE LISTED   Jacinta Shoe 06/29/2020, 1:58 PM  _________________________________________________________________   (provider comments below)

## 2020-07-02 ENCOUNTER — Other Ambulatory Visit: Payer: Self-pay | Admitting: Neurosurgery

## 2020-07-03 ENCOUNTER — Other Ambulatory Visit (HOSPITAL_COMMUNITY): Payer: Medicare HMO

## 2020-07-03 NOTE — Telephone Encounter (Signed)
It is okay to hold aspirin up to 5 days prior to spinal surgery.  He should resume when felt safe by the operating surgeon.

## 2020-07-03 NOTE — Telephone Encounter (Signed)
   Primary Cardiologist: Lesleigh Noe, MD  Chart reviewed as part of pre-operative protocol coverage. 2021 at which time he was doing well from a cardiac standpoint. Patient was contacted today for additional pre-op evaluation and reported doing well since last visit. No chest pain, shortness of breath, orthopnea, PND, palpitations, lightheadedness/dizziness, syncope. Able to complete >4.0 METS without any anginal symptoms. Patient was last seen by Dr. Katrinka Blazing in 6/Given past medical history and time since last visit, based on ACC/AHA guidelines, Charles Bonilla would be at acceptable risk for the planned procedure without further cardiovascular testing.   Per Dr. Katrinka Blazing, OK to hold Aspirin up to 5 days prior to spinal surgery. This should be resumed when felt safe by the operating surgeon.   I will route this recommendation to the requesting party via Epic fax function and remove from pre-op pool.  Please call with questions.  Corrin Parker, PA-C 07/03/2020, 10:29 AM

## 2020-07-06 ENCOUNTER — Other Ambulatory Visit (HOSPITAL_COMMUNITY)
Admission: RE | Admit: 2020-07-06 | Discharge: 2020-07-06 | Disposition: A | Discharge status: Home / Self Care | Payer: Medicare HMO | Source: Ambulatory Visit | Attending: Neurosurgery | Admitting: Neurosurgery

## 2020-07-06 DIAGNOSIS — Z01818 Encounter for other preprocedural examination: Secondary | ICD-10-CM | POA: Diagnosis not present

## 2020-07-06 DIAGNOSIS — Z20822 Contact with and (suspected) exposure to covid-19: Secondary | ICD-10-CM | POA: Insufficient documentation

## 2020-07-06 LAB — SARS CORONAVIRUS 2 (TAT 6-24 HRS): SARS Coronavirus 2: NEGATIVE

## 2020-07-09 ENCOUNTER — Encounter (HOSPITAL_COMMUNITY): Payer: Self-pay | Admitting: Neurosurgery

## 2020-07-09 ENCOUNTER — Other Ambulatory Visit: Payer: Self-pay

## 2020-07-09 NOTE — Progress Notes (Signed)
Patient denies shortness of breath, fever, cough or chest pain.  PCP - Dr Valentina Lucks Cardiologist - Dr Verdis Prime  Chest x-ray - n/a EKG - 02/06/20 Stress Test - n/a ECHO - n/a Cardiac Cath - n/a  Sleep Study -  Yes CPAP - uses nightly  Fasting Blood Sugar - 100-140s Checks Blood Sugar 1-2 times a day  . Do not take oral diabetes medicines (Metformin) the morning of surgery.  . THE NIGHT BEFORE SURGERY, Do not take Soliqua Insulin.      . If your blood sugar is less than 70 mg/dL, you will need to treat for low blood sugar: o Treat a low blood sugar (less than 70 mg/dL) with  cup of clear juice (cranberry or apple), 4 glucose tablets, OR glucose gel. o Recheck blood sugar in 15 minutes after treatment (to make sure it is greater than 70 mg/dL). If your blood sugar is not greater than 70 mg/dL on recheck, call [phone] for further instructions.  Aspirin Instructions:  Last dose of Aspirin was on 07/04/20.  Anesthesia review: Yes  STOP now taking any Aspirin (unless otherwise instructed by your surgeon), Aleve, Naproxen, Ibuprofen, Motrin, Advil, Goody's, BC's, all herbal medications, fish oil, and all vitamins.   Coronavirus Screening Covid test on 07/06/20 was negative.  Patient verbalized understanding of instructions that were given via phone.

## 2020-07-10 ENCOUNTER — Ambulatory Visit (HOSPITAL_COMMUNITY): Payer: Medicare HMO | Admitting: Certified Registered Nurse Anesthetist

## 2020-07-10 ENCOUNTER — Observation Stay (HOSPITAL_COMMUNITY)
Admission: RE | Admit: 2020-07-10 | Discharge: 2020-07-11 | Disposition: A | Discharge status: Home / Self Care | Payer: Medicare HMO | Attending: Neurosurgery | Admitting: Neurosurgery

## 2020-07-10 ENCOUNTER — Encounter (HOSPITAL_COMMUNITY)
Admission: RE | Disposition: A | Discharge status: Home / Self Care | Payer: Self-pay | Source: Home / Self Care | Attending: Neurosurgery

## 2020-07-10 ENCOUNTER — Encounter (HOSPITAL_COMMUNITY): Payer: Self-pay | Admitting: Neurosurgery

## 2020-07-10 ENCOUNTER — Ambulatory Visit (HOSPITAL_COMMUNITY): Payer: Medicare HMO

## 2020-07-10 DIAGNOSIS — Z7984 Long term (current) use of oral hypoglycemic drugs: Secondary | ICD-10-CM | POA: Diagnosis not present

## 2020-07-10 DIAGNOSIS — Z419 Encounter for procedure for purposes other than remedying health state, unspecified: Secondary | ICD-10-CM

## 2020-07-10 DIAGNOSIS — M47816 Spondylosis without myelopathy or radiculopathy, lumbar region: Secondary | ICD-10-CM | POA: Diagnosis not present

## 2020-07-10 DIAGNOSIS — I2510 Atherosclerotic heart disease of native coronary artery without angina pectoris: Secondary | ICD-10-CM | POA: Insufficient documentation

## 2020-07-10 DIAGNOSIS — I10 Essential (primary) hypertension: Secondary | ICD-10-CM | POA: Insufficient documentation

## 2020-07-10 DIAGNOSIS — G4733 Obstructive sleep apnea (adult) (pediatric): Secondary | ICD-10-CM | POA: Diagnosis not present

## 2020-07-10 DIAGNOSIS — M5126 Other intervertebral disc displacement, lumbar region: Secondary | ICD-10-CM | POA: Diagnosis not present

## 2020-07-10 DIAGNOSIS — Z96653 Presence of artificial knee joint, bilateral: Secondary | ICD-10-CM | POA: Diagnosis not present

## 2020-07-10 DIAGNOSIS — Z9989 Dependence on other enabling machines and devices: Secondary | ICD-10-CM | POA: Diagnosis not present

## 2020-07-10 DIAGNOSIS — Z79899 Other long term (current) drug therapy: Secondary | ICD-10-CM | POA: Diagnosis not present

## 2020-07-10 DIAGNOSIS — E119 Type 2 diabetes mellitus without complications: Secondary | ICD-10-CM | POA: Diagnosis not present

## 2020-07-10 DIAGNOSIS — Z981 Arthrodesis status: Secondary | ICD-10-CM | POA: Diagnosis not present

## 2020-07-10 DIAGNOSIS — I4510 Unspecified right bundle-branch block: Secondary | ICD-10-CM | POA: Diagnosis not present

## 2020-07-10 DIAGNOSIS — M79662 Pain in left lower leg: Secondary | ICD-10-CM | POA: Diagnosis present

## 2020-07-10 DIAGNOSIS — Z794 Long term (current) use of insulin: Secondary | ICD-10-CM | POA: Diagnosis not present

## 2020-07-10 HISTORY — PX: LUMBAR LAMINECTOMY/DECOMPRESSION MICRODISCECTOMY: SHX5026

## 2020-07-10 HISTORY — DX: Constipation, unspecified: K59.00

## 2020-07-10 HISTORY — DX: Type 2 diabetes mellitus without complications: E11.9

## 2020-07-10 HISTORY — DX: Depression, unspecified: F32.A

## 2020-07-10 LAB — CBC
HCT: 44.6 % (ref 39.0–52.0)
Hemoglobin: 14.3 g/dL (ref 13.0–17.0)
MCH: 29.7 pg (ref 26.0–34.0)
MCHC: 32.1 g/dL (ref 30.0–36.0)
MCV: 92.5 fL (ref 80.0–100.0)
Platelets: 191 10*3/uL (ref 150–400)
RBC: 4.82 MIL/uL (ref 4.22–5.81)
RDW: 13.2 % (ref 11.5–15.5)
WBC: 8.2 10*3/uL (ref 4.0–10.5)
nRBC: 0 % (ref 0.0–0.2)

## 2020-07-10 LAB — GLUCOSE, CAPILLARY
Glucose-Capillary: 124 mg/dL — ABNORMAL HIGH (ref 70–99)
Glucose-Capillary: 155 mg/dL — ABNORMAL HIGH (ref 70–99)
Glucose-Capillary: 174 mg/dL — ABNORMAL HIGH (ref 70–99)
Glucose-Capillary: 252 mg/dL — ABNORMAL HIGH (ref 70–99)
Glucose-Capillary: 250 mg/dL — ABNORMAL HIGH (ref 70–99)

## 2020-07-10 LAB — HEMOGLOBIN A1C
Hgb A1c MFr Bld: 7.5 % — ABNORMAL HIGH (ref 4.8–5.6)
Mean Plasma Glucose: 168.55 mg/dL

## 2020-07-10 LAB — BASIC METABOLIC PANEL
Anion gap: 9 (ref 5–15)
BUN: 12 mg/dL (ref 8–23)
CO2: 25 mmol/L (ref 22–32)
Calcium: 8.5 mg/dL — ABNORMAL LOW (ref 8.9–10.3)
Chloride: 102 mmol/L (ref 98–111)
Creatinine, Ser: 0.9 mg/dL (ref 0.61–1.24)
GFR, Estimated: >60 mL/min (ref >60)
Glucose, Bld: 134 mg/dL — ABNORMAL HIGH (ref 70–99)
Potassium: 4 mmol/L (ref 3.5–5.1)
Sodium: 136 mmol/L (ref 135–145)

## 2020-07-10 SURGERY — LUMBAR LAMINECTOMY/DECOMPRESSION MICRODISCECTOMY 1 LEVEL
Anesthesia: General | Site: Back | Laterality: Left

## 2020-07-10 MED ORDER — METHYLPREDNISOLONE ACETATE 80 MG/ML IJ SUSP
INTRAMUSCULAR | Status: DC | PRN
  Administered 2020-07-10: 80 mg

## 2020-07-10 MED ORDER — FENTANYL CITRATE (PF) 100 MCG/2ML IJ SOLN
INTRAMUSCULAR | Status: DC | PRN
Start: 2020-07-10 — End: 2020-07-10
  Administered 2020-07-10: 100 ug via INTRAVENOUS

## 2020-07-10 MED ORDER — PHENOL 1.4 % MT LIQD: 1.0000 | OROMUCOSAL | Status: DC | PRN

## 2020-07-10 MED ORDER — FENTANYL CITRATE (PF) 250 MCG/5ML IJ SOLN
INTRAMUSCULAR | Status: AC
  Filled 2020-07-10: qty 5

## 2020-07-10 MED ORDER — ACETAMINOPHEN 650 MG RE SUPP: 650.0000 mg | RECTAL | Status: DC | PRN

## 2020-07-10 MED ORDER — ONDANSETRON HCL 4 MG/2ML IJ SOLN: 4.0000 mg | Freq: Four times a day (QID) | INTRAMUSCULAR | Status: DC | PRN

## 2020-07-10 MED ORDER — LACTATED RINGERS IV SOLN: INTRAVENOUS | Status: DC

## 2020-07-10 MED ORDER — CELECOXIB 200 MG PO CAPS
200.0000 mg | ORAL_CAPSULE | Freq: Two times a day (BID) | ORAL | Status: DC
  Administered 2020-07-10 – 2020-07-11 (×3): 200 mg via ORAL
  Filled 2020-07-10 (×3): qty 1

## 2020-07-10 MED ORDER — DEXTROSE 5 % IV SOLN
INTRAVENOUS | Status: DC | PRN
  Administered 2020-07-10: 3 g via INTRAVENOUS

## 2020-07-10 MED ORDER — VITAMIN B-12 1000 MCG PO TABS
1000.0000 ug | ORAL_TABLET | Freq: Every day | ORAL | Status: DC
  Administered 2020-07-10 – 2020-07-11 (×2): 1000 ug via ORAL
  Filled 2020-07-10 (×2): qty 1

## 2020-07-10 MED ORDER — ONDANSETRON HCL 4 MG/2ML IJ SOLN
INTRAMUSCULAR | Status: DC | PRN
  Administered 2020-07-10: 4 mg via INTRAVENOUS

## 2020-07-10 MED ORDER — SUCCINYLCHOLINE CHLORIDE 20 MG/ML IJ SOLN
INTRAMUSCULAR | Status: DC | PRN
  Administered 2020-07-10: 160 mg via INTRAVENOUS

## 2020-07-10 MED ORDER — AMLODIPINE BESYLATE 5 MG PO TABS
5.0000 mg | ORAL_TABLET | Freq: Every day | ORAL | Status: DC
  Administered 2020-07-10 – 2020-07-11 (×2): 5 mg via ORAL
  Filled 2020-07-10 (×2): qty 1

## 2020-07-10 MED ORDER — THROMBIN 5000 UNITS EX SOLR
CUTANEOUS | Status: DC | PRN
  Administered 2020-07-10 (×2): 5000 [IU] via TOPICAL

## 2020-07-10 MED ORDER — PROPOFOL 10 MG/ML IV BOLUS
INTRAVENOUS | Status: DC | PRN
  Administered 2020-07-10: 180 mg via INTRAVENOUS

## 2020-07-10 MED ORDER — VITAMIN D 25 MCG (1000 UNIT) PO TABS
50.0000 ug | ORAL_TABLET | Freq: Every day | ORAL | Status: DC
  Administered 2020-07-10 – 2020-07-11 (×2): 50 ug via ORAL
  Filled 2020-07-10 (×2): qty 1

## 2020-07-10 MED ORDER — MAGNESIUM OXIDE 400 (241.3 MG) MG PO TABS
200.0000 mg | ORAL_TABLET | Freq: Every day | ORAL | Status: DC
  Administered 2020-07-10 – 2020-07-11 (×2): 200 mg via ORAL
  Filled 2020-07-10 (×2): qty 1

## 2020-07-10 MED ORDER — INSULIN GLARGINE 100 UNIT/ML ~~LOC~~ SOLN
50.0000 [IU] | Freq: Every day | SUBCUTANEOUS | Status: DC
  Administered 2020-07-10: 50 [IU] via SUBCUTANEOUS
  Filled 2020-07-10 (×4): qty 0.5

## 2020-07-10 MED ORDER — SIMVASTATIN 20 MG PO TABS
20.0000 mg | ORAL_TABLET | Freq: Every day | ORAL | Status: DC
  Administered 2020-07-10: 20 mg via ORAL
  Filled 2020-07-10: qty 1

## 2020-07-10 MED ORDER — DEXAMETHASONE SODIUM PHOSPHATE 10 MG/ML IJ SOLN
INTRAMUSCULAR | Status: AC
  Filled 2020-07-10: qty 1

## 2020-07-10 MED ORDER — OXYCODONE HCL 5 MG PO TABS: 10.0000 mg | ORAL_TABLET | ORAL | Status: DC | PRN

## 2020-07-10 MED ORDER — OXYCODONE HCL 5 MG PO TABS: 5.0000 mg | ORAL_TABLET | ORAL | Status: DC | PRN

## 2020-07-10 MED ORDER — DIAZEPAM 5 MG PO TABS
5.0000 mg | ORAL_TABLET | Freq: Four times a day (QID) | ORAL | Status: DC | PRN
  Administered 2020-07-10: 5 mg via ORAL

## 2020-07-10 MED ORDER — ROCURONIUM BROMIDE 100 MG/10ML IV SOLN
INTRAVENOUS | Status: DC | PRN
  Administered 2020-07-10: 50 mg via INTRAVENOUS
  Administered 2020-07-10 (×2): 10 mg via INTRAVENOUS

## 2020-07-10 MED ORDER — SUGAMMADEX SODIUM 200 MG/2ML IV SOLN
INTRAVENOUS | Status: DC | PRN
  Administered 2020-07-10: 200 mg via INTRAVENOUS
  Administered 2020-07-10: 100 mg via INTRAVENOUS

## 2020-07-10 MED ORDER — SUGAMMADEX SODIUM 500 MG/5ML IV SOLN
INTRAVENOUS | Status: AC
  Filled 2020-07-10: qty 5

## 2020-07-10 MED ORDER — SODIUM CHLORIDE 0.9% FLUSH
3.0000 mL | Freq: Two times a day (BID) | INTRAVENOUS | Status: DC
  Administered 2020-07-10: 3 mL via INTRAVENOUS

## 2020-07-10 MED ORDER — LIDOCAINE-EPINEPHRINE 0.5 %-1:200000 IJ SOLN
INTRAMUSCULAR | Status: AC
  Filled 2020-07-10: qty 1

## 2020-07-10 MED ORDER — FENTANYL CITRATE (PF) 100 MCG/2ML IJ SOLN
INTRAMUSCULAR | Status: AC
  Filled 2020-07-10: qty 2

## 2020-07-10 MED ORDER — EPHEDRINE SULFATE-NACL 50-0.9 MG/10ML-% IV SOSY
PREFILLED_SYRINGE | INTRAVENOUS | Status: DC | PRN
  Administered 2020-07-10 (×5): 5 mg via INTRAVENOUS

## 2020-07-10 MED ORDER — NITROGLYCERIN 0.4 MG SL SUBL: 0.4000 mg | SUBLINGUAL_TABLET | SUBLINGUAL | Status: DC | PRN

## 2020-07-10 MED ORDER — HEPARIN SODIUM (PORCINE) 5000 UNIT/ML IJ SOLN
5000.0000 [IU] | Freq: Three times a day (TID) | INTRAMUSCULAR | Status: DC
  Administered 2020-07-10 – 2020-07-11 (×3): 5000 [IU] via SUBCUTANEOUS
  Filled 2020-07-10 (×3): qty 1

## 2020-07-10 MED ORDER — PROPOFOL 10 MG/ML IV BOLUS
INTRAVENOUS | Status: AC
  Filled 2020-07-10: qty 20

## 2020-07-10 MED ORDER — LIDOCAINE HCL (PF) 2 % IJ SOLN
INTRAMUSCULAR | Status: AC
  Filled 2020-07-10: qty 5

## 2020-07-10 MED ORDER — SUCCINYLCHOLINE CHLORIDE 200 MG/10ML IV SOSY
PREFILLED_SYRINGE | INTRAVENOUS | Status: AC
  Filled 2020-07-10: qty 10

## 2020-07-10 MED ORDER — METFORMIN HCL 500 MG PO TABS
1000.0000 mg | ORAL_TABLET | Freq: Two times a day (BID) | ORAL | Status: DC
  Administered 2020-07-10 – 2020-07-11 (×2): 1000 mg via ORAL
  Filled 2020-07-10 (×2): qty 2

## 2020-07-10 MED ORDER — DIAZEPAM 5 MG PO TABS
ORAL_TABLET | ORAL | Status: AC
  Filled 2020-07-10: qty 1

## 2020-07-10 MED ORDER — BUPIVACAINE HCL (PF) 0.5 % IJ SOLN
INTRAMUSCULAR | Status: AC
  Filled 2020-07-10: qty 30

## 2020-07-10 MED ORDER — ORAL CARE MOUTH RINSE: 15.0000 mL | Freq: Once | OROMUCOSAL | Status: AC

## 2020-07-10 MED ORDER — MORPHINE SULFATE (PF) 2 MG/ML IV SOLN: 2.0000 mg | INTRAVENOUS | Status: DC | PRN

## 2020-07-10 MED ORDER — HEMOSTATIC AGENTS (NO CHARGE) OPTIME
TOPICAL | Status: DC | PRN
  Administered 2020-07-10: 1 via TOPICAL

## 2020-07-10 MED ORDER — ROCURONIUM BROMIDE 10 MG/ML (PF) SYRINGE
PREFILLED_SYRINGE | INTRAVENOUS | Status: AC
  Filled 2020-07-10: qty 10

## 2020-07-10 MED ORDER — ALBUMIN HUMAN 5 % IV SOLN: INTRAVENOUS | Status: DC | PRN

## 2020-07-10 MED ORDER — LACTATED RINGERS IV SOLN: INTRAVENOUS | Status: DC | PRN

## 2020-07-10 MED ORDER — SPIRONOLACTONE 25 MG PO TABS
25.0000 mg | ORAL_TABLET | Freq: Every day | ORAL | Status: DC
  Administered 2020-07-10 – 2020-07-11 (×2): 25 mg via ORAL
  Filled 2020-07-10 (×2): qty 1

## 2020-07-10 MED ORDER — SENNA 8.6 MG PO TABS
1.0000 | ORAL_TABLET | Freq: Two times a day (BID) | ORAL | Status: DC
  Administered 2020-07-10 – 2020-07-11 (×2): 8.6 mg via ORAL
  Filled 2020-07-10 (×2): qty 1

## 2020-07-10 MED ORDER — SODIUM CHLORIDE 0.9% FLUSH: 3.0000 mL | INTRAVENOUS | Status: DC | PRN

## 2020-07-10 MED ORDER — INSULIN ASPART 100 UNIT/ML ~~LOC~~ SOLN
0.0000 [IU] | Freq: Three times a day (TID) | SUBCUTANEOUS | Status: DC
  Administered 2020-07-10: 11 [IU] via SUBCUTANEOUS
  Administered 2020-07-11: 4 [IU] via SUBCUTANEOUS

## 2020-07-10 MED ORDER — 0.9 % SODIUM CHLORIDE (POUR BTL) OPTIME
TOPICAL | Status: DC | PRN
  Administered 2020-07-10: 1000 mL

## 2020-07-10 MED ORDER — DEXAMETHASONE SODIUM PHOSPHATE 10 MG/ML IJ SOLN
INTRAMUSCULAR | Status: DC | PRN
  Administered 2020-07-10: 5 mg via INTRAVENOUS

## 2020-07-10 MED ORDER — ACETAMINOPHEN 325 MG PO TABS: 650.0000 mg | ORAL_TABLET | ORAL | Status: DC | PRN

## 2020-07-10 MED ORDER — CHLORHEXIDINE GLUCONATE 0.12 % MT SOLN
15.0000 mL | Freq: Once | OROMUCOSAL | Status: AC
  Administered 2020-07-10: 15 mL via OROMUCOSAL
  Filled 2020-07-10: qty 15

## 2020-07-10 MED ORDER — POTASSIUM CHLORIDE IN NACL 20-0.9 MEQ/L-% IV SOLN: INTRAVENOUS | Status: DC

## 2020-07-10 MED ORDER — ZOLPIDEM TARTRATE 5 MG PO TABS: 5.0000 mg | ORAL_TABLET | Freq: Every evening | ORAL | Status: DC | PRN

## 2020-07-10 MED ORDER — INSULIN GLARGINE-LIXISENATIDE 100-33 UNT-MCG/ML ~~LOC~~ SOPN: 50.0000 [IU] | PEN_INJECTOR | Freq: Every day | SUBCUTANEOUS | Status: DC

## 2020-07-10 MED ORDER — ESCITALOPRAM OXALATE 20 MG PO TABS
20.0000 mg | ORAL_TABLET | Freq: Every day | ORAL | Status: DC
  Administered 2020-07-10 – 2020-07-11 (×2): 20 mg via ORAL
  Filled 2020-07-10 (×2): qty 1

## 2020-07-10 MED ORDER — OXYCODONE HCL ER 10 MG PO T12A
10.0000 mg | EXTENDED_RELEASE_TABLET | Freq: Two times a day (BID) | ORAL | Status: DC
  Filled 2020-07-10: qty 1

## 2020-07-10 MED ORDER — ONDANSETRON HCL 4 MG PO TABS: 4.0000 mg | ORAL_TABLET | Freq: Four times a day (QID) | ORAL | Status: DC | PRN

## 2020-07-10 MED ORDER — ONDANSETRON HCL 4 MG/2ML IJ SOLN
INTRAMUSCULAR | Status: AC
  Filled 2020-07-10: qty 2

## 2020-07-10 MED ORDER — LIDOCAINE 2% (20 MG/ML) 5 ML SYRINGE
INTRAMUSCULAR | Status: DC | PRN
  Administered 2020-07-10: 100 mg via INTRAVENOUS

## 2020-07-10 MED ORDER — LIDOCAINE-EPINEPHRINE 0.5 %-1:200000 IJ SOLN
INTRAMUSCULAR | Status: DC | PRN
  Administered 2020-07-10: 10 mL

## 2020-07-10 MED ORDER — EPHEDRINE 5 MG/ML INJ
INTRAVENOUS | Status: AC
  Filled 2020-07-10: qty 10

## 2020-07-10 MED ORDER — METHYLPREDNISOLONE ACETATE 80 MG/ML IJ SUSP
INTRAMUSCULAR | Status: AC
  Filled 2020-07-10: qty 1

## 2020-07-10 MED ORDER — ASPIRIN EC 81 MG PO TBEC
81.0000 mg | DELAYED_RELEASE_TABLET | Freq: Every day | ORAL | Status: DC
  Administered 2020-07-10: 81 mg via ORAL
  Filled 2020-07-10: qty 1

## 2020-07-10 MED ORDER — CEFAZOLIN SODIUM 1 G IJ SOLR
INTRAMUSCULAR | Status: AC
  Filled 2020-07-10: qty 30

## 2020-07-10 MED ORDER — MENTHOL 3 MG MT LOZG: 1.0000 | LOZENGE | OROMUCOSAL | Status: DC | PRN

## 2020-07-10 MED ORDER — FENTANYL CITRATE (PF) 250 MCG/5ML IJ SOLN
INTRAMUSCULAR | Status: DC | PRN
  Administered 2020-07-10: 25 ug via INTRAVENOUS
  Administered 2020-07-10: 100 ug via INTRAVENOUS

## 2020-07-10 MED ORDER — SODIUM CHLORIDE 0.9 % IV SOLN: 250.0000 mL | INTRAVENOUS | Status: DC

## 2020-07-10 MED ORDER — THROMBIN 5000 UNITS EX SOLR
CUTANEOUS | Status: AC
  Filled 2020-07-10: qty 10000

## 2020-07-10 SURGICAL SUPPLY — 53 items
BAND RUBBER #18 3X1/16 STRL (MISCELLANEOUS) ×4 IMPLANT
BENZOIN TINCTURE PRP APPL 2/3 (GAUZE/BANDAGES/DRESSINGS) IMPLANT
BLADE CLIPPER SURG (BLADE) IMPLANT
BUR MATCHSTICK NEURO 3.0 LAGG (BURR) ×2 IMPLANT
BUR PRECISION FLUTE 5.0 (BURR) ×2 IMPLANT
CANISTER SUCT 3000ML PPV (MISCELLANEOUS) ×2 IMPLANT
CARTRIDGE OIL MAESTRO DRILL (MISCELLANEOUS) ×1 IMPLANT
COVER WAND RF STERILE (DRAPES) IMPLANT
DECANTER SPIKE VIAL GLASS SM (MISCELLANEOUS) ×2 IMPLANT
DERMABOND ADVANCED (GAUZE/BANDAGES/DRESSINGS) ×1
DERMABOND ADVANCED .7 DNX12 (GAUZE/BANDAGES/DRESSINGS) ×1 IMPLANT
DIFFUSER DRILL AIR PNEUMATIC (MISCELLANEOUS) ×2 IMPLANT
DRAPE LAPAROTOMY 100X72X124 (DRAPES) ×2 IMPLANT
DRAPE MICROSCOPE LEICA (MISCELLANEOUS) ×2 IMPLANT
DRAPE SURG 17X23 STRL (DRAPES) ×2 IMPLANT
DURAPREP 26ML APPLICATOR (WOUND CARE) ×2 IMPLANT
ELECT BLADE 4.0 EZ CLEAN MEGAD (MISCELLANEOUS) ×2
ELECT REM PT RETURN 9FT ADLT (ELECTROSURGICAL) ×2
ELECTRODE BLDE 4.0 EZ CLN MEGD (MISCELLANEOUS) ×1 IMPLANT
ELECTRODE REM PT RTRN 9FT ADLT (ELECTROSURGICAL) ×1 IMPLANT
GAUZE 4X4 16PLY RFD (DISPOSABLE) IMPLANT
GAUZE SPONGE 4X4 12PLY STRL (GAUZE/BANDAGES/DRESSINGS) IMPLANT
GLOVE BIOGEL PI IND STRL 8 (GLOVE) ×5 IMPLANT
GLOVE BIOGEL PI INDICATOR 8 (GLOVE) ×5
GLOVE ECLIPSE 6.5 STRL STRAW (GLOVE) ×2 IMPLANT
GLOVE ECLIPSE 7.5 STRL STRAW (GLOVE) ×6 IMPLANT
GLOVE ECLIPSE 8.0 STRL XLNG CF (GLOVE) ×2 IMPLANT
GLOVE EXAM NITRILE XL STR (GLOVE) IMPLANT
GOWN STRL REUS W/ TWL LRG LVL3 (GOWN DISPOSABLE) ×1 IMPLANT
GOWN STRL REUS W/ TWL XL LVL3 (GOWN DISPOSABLE) ×1 IMPLANT
GOWN STRL REUS W/TWL 2XL LVL3 (GOWN DISPOSABLE) ×4 IMPLANT
GOWN STRL REUS W/TWL LRG LVL3 (GOWN DISPOSABLE) ×2
GOWN STRL REUS W/TWL XL LVL3 (GOWN DISPOSABLE) ×1
KIT BASIN OR (CUSTOM PROCEDURE TRAY) ×2 IMPLANT
KIT TURNOVER KIT B (KITS) ×2 IMPLANT
NEEDLE HYPO 18GX1.5 BLUNT FILL (NEEDLE) ×2 IMPLANT
NEEDLE HYPO 25X1 1.5 SAFETY (NEEDLE) ×2 IMPLANT
NEEDLE SPNL 18GX3.5 QUINCKE PK (NEEDLE) ×2 IMPLANT
NS IRRIG 1000ML POUR BTL (IV SOLUTION) ×2 IMPLANT
OIL CARTRIDGE MAESTRO DRILL (MISCELLANEOUS) ×2
PACK LAMINECTOMY NEURO (CUSTOM PROCEDURE TRAY) ×2 IMPLANT
PAD ARMBOARD 7.5X6 YLW CONV (MISCELLANEOUS) ×6 IMPLANT
SPONGE LAP 4X18 RFD (DISPOSABLE) IMPLANT
SPONGE SURGIFOAM ABS GEL SZ50 (HEMOSTASIS) ×2 IMPLANT
STRIP CLOSURE SKIN 1/2X4 (GAUZE/BANDAGES/DRESSINGS) IMPLANT
SUT VIC AB 0 CT1 18XCR BRD8 (SUTURE) ×2 IMPLANT
SUT VIC AB 0 CT1 8-18 (SUTURE) ×2
SUT VIC AB 2-0 CT1 18 (SUTURE) ×2 IMPLANT
SUT VIC AB 3-0 SH 8-18 (SUTURE) ×4 IMPLANT
SYR 5ML LL (SYRINGE) ×2 IMPLANT
TOWEL GREEN STERILE (TOWEL DISPOSABLE) ×2 IMPLANT
TOWEL GREEN STERILE FF (TOWEL DISPOSABLE) ×2 IMPLANT
WATER STERILE IRR 1000ML POUR (IV SOLUTION) ×2 IMPLANT

## 2020-07-10 NOTE — Anesthesia Postprocedure Evaluation (Signed)
Anesthesia Post Note  Patient: Charles Bonilla  Procedure(s) Performed: Left Lumbar two-three microdiscectomy (Left Back)     Patient location during evaluation: PACU Anesthesia Type: General Level of consciousness: awake and alert Pain management: pain level controlled Vital Signs Assessment: post-procedure vital signs reviewed and stable Respiratory status: spontaneous breathing, nonlabored ventilation, respiratory function stable and patient connected to nasal cannula oxygen Cardiovascular status: blood pressure returned to baseline and stable Postop Assessment: no apparent nausea or vomiting Anesthetic complications: no   No complications documented.  Last Vitals:  Vitals:   07/10/20 1222 07/10/20 1427  BP: 127/71 118/63  Pulse: 64 67  Resp: 18   Temp: 36.7 C   SpO2: 99% 95%    Last Pain:  Vitals:   07/10/20 1225  TempSrc:   PainSc: 0-No pain                 Latysha Thackston S

## 2020-07-10 NOTE — Anesthesia Procedure Notes (Signed)
Procedure Name: Intubation Date/Time: 07/10/2020 8:05 AM Performed by: Audie Pinto, CRNA Pre-anesthesia Checklist: Patient identified, Emergency Drugs available, Suction available and Patient being monitored Patient Re-evaluated:Patient Re-evaluated prior to induction Oxygen Delivery Method: Circle system utilized Preoxygenation: Pre-oxygenation with 100% oxygen Induction Type: IV induction Ventilation: Oral airway inserted - appropriate to patient size and Two handed mask ventilation required Laryngoscope Size: McGraph and 3 Grade View: Grade I Tube type: Oral Tube size: 7.5 mm Number of attempts: 1 Airway Equipment and Method: Stylet and Oral airway Placement Confirmation: ETT inserted through vocal cords under direct vision,  positive ETCO2 and breath sounds checked- equal and bilateral Secured at: 23 cm Tube secured with: Tape Dental Injury: Teeth and Oropharynx as per pre-operative assessment  Difficulty Due To: Difficulty was anticipated and Difficult Airway- due to large tongue

## 2020-07-10 NOTE — Op Note (Signed)
07/10/2020  9:58 AM  PATIENT:  Charles Bonilla  86 y.o. male  PRE-OPERATIVE DIAGNOSIS:  Lumbar herniated disc, L2/3  POST-OPERATIVE DIAGNOSIS:  Lumbar herniated disc, L2/3  PROCEDURE:  Procedure(s): Left Lumbar two-three microdiscectomy  SURGEON:   Surgeon(s): Coletta Memos, MD Dawley, Alan Mulder, DO  ASSISTANTS:Dawley, Kendell Bane  ANESTHESIA:   local and general  EBL:  Total I/O In: 250 [IV Piggyback:250] Out: 150 [Blood:150]  BLOOD ADMINISTERED:none  CELL SAVER GIVEN:none  COUNT:per nursing  DRAINS: none   SPECIMEN:  No Specimen  DICTATION: Mr. Eskenazi was taken to the operating room, intubated and placed under a general anesthetic without difficulty. He was positioned prone on a Wilson frame with all pressure points padded. HIs back was prepped and draped in a sterile manner. I opened the skin with a 10 blade and carried the dissection down to the thoracolumbar fascia. I used both sharp dissection and the monopolar cautery to expose the lamina of L2, and L3. I confirmed my location with an intraoperative xray.  I used the drill, Kerrison punches, and curettes to perform a semihemilaminectomy of L2 and L3. I used the punches to remove the ligamentum flavum to expose the thecal sac. I brought the microscope into the operative field and with Dr.Dawley's assistance we started our decompression of the spinal canal, thecal sac and L3 root(s). I cauterized epidural veins overlying the disc space then divided them sharply. I opened the disc space with a 15 blade and proceeded with the discectomy. I used pituitary rongeurs, curettes, and other instruments to remove disc material. After the discectomy was completed we inspected the L3 nerve root and felt it was well decompressed. I explored rostrally, laterally, medially, and caudally and was satisfied with the decompression. I irrigated the wound, then closed in layers. I approximated the thoracolumbar fascia, subcutaneous, and subcuticular  planes with vicryl sutures. I used dermabond for a sterile dressing.   PLAN OF CARE: Admit for overnight observation  PATIENT DISPOSITION:  PACU - hemodynamically stable.   Delay start of Pharmacological VTE agent (>24hrs) due to surgical blood loss or risk of bleeding:  no

## 2020-07-10 NOTE — Anesthesia Preprocedure Evaluation (Signed)
Anesthesia Evaluation    Airway Mallampati: II  TM Distance: >3 FB Neck ROM: Full    Dental no notable dental hx.    Pulmonary sleep apnea and Continuous Positive Airway Pressure Ventilation , former smoker,    Pulmonary exam normal breath sounds clear to auscultation       Cardiovascular hypertension, + CAD and + Cardiac Stents  Normal cardiovascular exam Rhythm:Regular Rate:Normal     Neuro/Psych    GI/Hepatic   Endo/Other  diabetes, Type obesity  Renal/GU      Musculoskeletal   Abdominal   Peds  Hematology   Anesthesia Other Findings   Reproductive/Obstetrics                             Anesthesia Physical Anesthesia Plan  ASA: III  Anesthesia Plan: General   Post-op Pain Management:    Induction: Intravenous  PONV Risk Score and Plan: 2 and Ondansetron, Dexamethasone and Treatment may vary due to age or medical condition  Airway Management Planned: Oral ETT  Additional Equipment:   Intra-op Plan:   Post-operative Plan: Extubation in OR  Informed Consent: I have reviewed the patients History and Physical, chart, labs and discussed the procedure including the risks, benefits and alternatives for the proposed anesthesia with the patient or authorized representative who has indicated his/her understanding and acceptance.     Dental advisory given  Plan Discussed with: CRNA and Surgeon  Anesthesia Plan Comments:         Anesthesia Quick Evaluation

## 2020-07-10 NOTE — Transfer of Care (Signed)
Immediate Anesthesia Transfer of Care Note  Patient: Charles Bonilla  Procedure(s) Performed: Left Lumbar two-three microdiscectomy (Left Back)  Patient Location: PACU  Anesthesia Type:General  Level of Consciousness: awake, alert  and patient cooperative  Airway & Oxygen Therapy: Patient Spontanous Breathing and Patient connected to face mask oxygen  Post-op Assessment: Report given to RN and Post -op Vital signs reviewed and stable. Pt moving extremities x4.  Post vital signs: Reviewed  Last Vitals:  Vitals Value Taken Time  BP 145/76 07/10/20 0957  Temp    Pulse 76 07/10/20 1000  Resp 24 07/10/20 1000  SpO2 93 % 07/10/20 1000  Vitals shown include unvalidated device data.  Last Pain:  Vitals:   07/10/20 0645  TempSrc:   PainSc: 0-No pain      Patients Stated Pain Goal: 3 (07/10/20 0645)  Complications: No complications documented.

## 2020-07-10 NOTE — Evaluation (Addendum)
Physical Therapy Evaluation Patient Details Name: Charles Bonilla MRN: [id number] DOB: 1934-07-07 Today's Date: 07/10/2020   History of Present Illness  Pt admitted for Left Lumbar two-three microdiscectomy. Pt has approximately a 3-week history of severe pain and weakness in his left lower extremity.  He has fallen 5 times at the very least during this period of time.   Clinical Impression  Pt fully participated in session. Pt requiring increased time for mobility and gait. Pt able to ambulate with SPC with min guard and could possibly benefit from RW to improve gait mechanics and decrease fall risk. Pt will benefit from skilled PT to address balance, gait and endurance to maximize independence with functional mobility prior to discharge. PT does not anticipate follow up PT will be needed    Follow Up Recommendations No PT follow up;Supervision - Intermittent    Equipment Recommendations  Rolling walker with 5" wheels (possible RW to improve endurance and gait mechanics)    Recommendations for Other Services       Precautions / Restrictions Precautions Precautions: Fall Restrictions Weight Bearing Restrictions: No      Mobility  Bed Mobility Overal bed mobility: Needs Assistance Bed Mobility: Supine to Sit;Sit to Supine     Supine to sit: Supervision;HOB elevated Sit to supine: Supervision;HOB elevated        Transfers Overall transfer level: Needs assistance Equipment used: Straight cane Transfers: Sit to/from Stand Sit to Stand: Min guard            Ambulation/Gait Ambulation/Gait assistance: Min guard Gait Distance (Feet): 160 Feet Assistive device: Straight cane   Gait velocity: decreased   General Gait Details: increased lateral weight shift, dicreased step length and height B, multiple standing rest breaks  Stairs            Wheelchair Mobility    Modified Rankin (Stroke Patients Only)       Balance Overall balance assessment: Mild  deficits observed, not formally tested                                           Pertinent Vitals/Pain Pain Assessment: No/denies pain (pt stated he had slight discomfort but no pain)    Home Living Family/patient expects to be discharged to:: Private residence Living Arrangements: Spouse/significant other Available Help at Discharge: Family;Available 24 hours/day Type of Home: House Home Access: Level entry     Home Layout: One level Home Equipment: Cane - quad;Shower seat;Grab bars - tub/shower      Prior Function Level of Independence: Independent         Comments: occasionally used a cane     Hand Dominance   Dominant Hand: Right    Extremity/Trunk Assessment   Upper Extremity Assessment Upper Extremity Assessment: Defer to OT evaluation    Lower Extremity Assessment Lower Extremity Assessment: Overall WFL for tasks assessed       Communication   Communication: No difficulties  Cognition Arousal/Alertness: Awake/alert Behavior During Therapy: WFL for tasks assessed/performed Overall Cognitive Status: Within Functional Limits for tasks assessed                                        General Comments      Exercises     Assessment/Plan    PT Assessment Patient  needs continued PT services  PT Problem List Decreased mobility;Decreased balance;Decreased activity tolerance       PT Treatment Interventions Therapeutic exercise;Gait training;Balance training;Therapeutic activities;Patient/family education    PT Goals (Current goals can be found in the Care Plan section)  Acute Rehab PT Goals Patient Stated Goal: I will go home tomorrow PT Goal Formulation: With patient Time For Goal Achievement: 07/18/20 Potential to Achieve Goals: Good    Frequency Min 5X/week   Barriers to discharge        Co-evaluation               AM-PAC PT "6 Clicks" Mobility  Outcome Measure Help needed turning from your back  to your side while in a flat bed without using bedrails?: None Help needed moving from lying on your back to sitting on the side of a flat bed without using bedrails?: A Little Help needed moving to and from a bed to a chair (including a wheelchair)?: None Help needed standing up from a chair using your arms (e.g., wheelchair or bedside chair)?: A Little Help needed to walk in hospital room?: A Little Help needed climbing 3-5 steps with a railing? : A Little 6 Click Score: 20    End of Session Equipment Utilized During Treatment: Gait belt Activity Tolerance: Patient tolerated treatment well Patient left: in bed;with call bell/phone within reach Nurse Communication: Mobility status PT Visit Diagnosis: Unsteadiness on feet (R26.81);Other abnormalities of gait and mobility (R26.89)    Time: 1341-1416 PT Time Calculation (min) (ACUTE ONLY): 35 min   Charges:   PT Evaluation $PT Eval Low Complexity: 1 Low PT Treatments $Gait Training: 8-22 mins        Ginette Otto, DPT Acute Rehabilitation Services [phone]  Lucretia Field 07/10/2020, 2:31 PM

## 2020-07-10 NOTE — H&P (Signed)
BP 119/63   Pulse (!) 58   Temp 97.7 F (36.5 C) (Temporal)   Resp 18   Ht 5\' 11"  (1.803 m)   Wt (!) 142.9 kg   SpO2 98%   BMI 43.94 kg/m     Charles Bonilla has approximately a 3-week history of severe pain and weakness in his left lower extremity.  He has fallen 5 times at the very least during this period of time.  Going up stairs, going down stairs, all have been quite difficult.  He has had back pain for years, but never had anything just like this.  He underwent an MRI and our office was called today by his physician, Dr. Leeroy Cha.  Dr. Valentina Lucks wanted to make sure that we were able to see Mr. Tully in a more prompt fashion.     VITAL SIGNS :  Weight 316 pounds, temperature is 98, blood pressure 155/73, pulse is 73. Pain, he says, is 1/10.     SOCIAL HISTORY :  He is 86 years of age right-handed.  He does smoke.  No history of substance abuse.  He does drink alcohol daily.  He has a history of diabetes.  He underwent a stent implant by Dr. 97 in 2013, had a knee replacement on 2 occasions, left and right side.     ALLERGIES :  No known drug allergies.     MEDICATIONS :  He takes Metformin, Amlodipine, Insulin, and Lexapro.     FAMILY HISTORY :  Mother and father both deceased.       Weakness in the left lower extremity.  No numbness and tingling.  No bowel or bladder dysfunction.     REVIEW OF SYSTEMS :  Positive for leg weakness and left lower extremity pain.     PHYSICAL EXAMINATION :  He is alert, oriented by 4, answers all questions appropriately.  Memory, language, attention span, and fund of knowledge are normal.  He has 4/5 strength in the hip flexors and quadriceps, 5/5 in the right lower extremity.  He is able to stand, though it is a slow process to get him from sitting to standing.  Romberg is negative.  Muscle tone, bulk were normal.  He has some erythema to the lower extremities and some edema also.     IMAGING :  MRI is reviewed.  What it shows is a large disc herniation at L2-3 which has migrated caudally from the disc space on the left side.  It is in the lateral recess obviously compromising the L2 and L3 roots.  Conus is normal. Cauda equina is normal.  No infections, no tumors appreciated.     ASSESSMENT AND PLAN :  I believe due to the weakness on his exam that Mr. Roylance needs to undergo operative decompression of the L2, L3 nerve roots and to have the disc fragment removed.  Risks and benefits were discussed.  I do not believe that there is any other treatment that is warranted at this point.  He could try time, he could try physical therapy, he could try injections; none of this, however, I believe, would give him a chance to regain his strength at this point.  He has agreed and we will get this scheduled for November 30th.

## 2020-07-11 ENCOUNTER — Encounter (HOSPITAL_COMMUNITY): Payer: Self-pay | Admitting: Neurosurgery

## 2020-07-11 DIAGNOSIS — Z96653 Presence of artificial knee joint, bilateral: Secondary | ICD-10-CM | POA: Diagnosis not present

## 2020-07-11 DIAGNOSIS — M5126 Other intervertebral disc displacement, lumbar region: Secondary | ICD-10-CM | POA: Diagnosis not present

## 2020-07-11 DIAGNOSIS — Z794 Long term (current) use of insulin: Secondary | ICD-10-CM | POA: Diagnosis not present

## 2020-07-11 DIAGNOSIS — I10 Essential (primary) hypertension: Secondary | ICD-10-CM | POA: Diagnosis not present

## 2020-07-11 DIAGNOSIS — Z7984 Long term (current) use of oral hypoglycemic drugs: Secondary | ICD-10-CM | POA: Diagnosis not present

## 2020-07-11 DIAGNOSIS — Z79899 Other long term (current) drug therapy: Secondary | ICD-10-CM | POA: Diagnosis not present

## 2020-07-11 DIAGNOSIS — I2510 Atherosclerotic heart disease of native coronary artery without angina pectoris: Secondary | ICD-10-CM | POA: Diagnosis not present

## 2020-07-11 DIAGNOSIS — E119 Type 2 diabetes mellitus without complications: Secondary | ICD-10-CM | POA: Diagnosis not present

## 2020-07-11 LAB — GLUCOSE, CAPILLARY
Glucose-Capillary: 192 mg/dL — ABNORMAL HIGH (ref 70–99)
Glucose-Capillary: 136 mg/dL — ABNORMAL HIGH (ref 70–99)

## 2020-07-11 MED ORDER — TIZANIDINE HCL 4 MG PO TABS: 4.0000 mg | ORAL_TABLET | Freq: Four times a day (QID) | ORAL | 0 refills | Status: DC | PRN

## 2020-07-11 NOTE — Discharge Summary (Signed)
Physician Discharge Summary  Patient ID: Charles Bonilla MRN: [id number] DOB/AGE: 12-11-1933 86 y.o.  Admit date: 07/10/2020 Discharge date: 07/11/2020  Admission Diagnoses:left L2/3 hnp  Discharge Diagnoses: same Active Problems:   HNP (herniated nucleus pulposus), lumbar   Discharged Condition: good  Hospital Course: Mr. Magnone was admitted and taken to the operating room for an uncomplicated discetomy. He post op is ambulating, voiding, and tolerating a regular diet. His wound is clean, dry, and without signs of infection.   Treatments: surgery: Left L2/3 discetomy, free fragment  Discharge Exam: Blood pressure 125/68, pulse 62, temperature 97.9 F (36.6 C), temperature source Oral, resp. rate 18, height 5\' 11"  (1.803 m), weight (!) 142.9 kg, SpO2 98 %. General appearance: alert, cooperative, appears stated age and no distress  Disposition: Discharge disposition: 01-Home or Self Care      Lumbar herniated disc  Allergies as of 07/11/2020   No Known Allergies     Medication List    TAKE these medications   amLODipine 5 MG tablet Commonly known as: NORVASC Take 5 mg by mouth daily.   aspirin EC 81 MG tablet Take 81 mg by mouth at bedtime.   Droplet Pen Needles 32G X 6 MM Misc Generic drug: Insulin Pen Needle   escitalopram 20 MG tablet Commonly known as: LEXAPRO Take 20 mg by mouth daily.   Magnesium 250 MG Tabs Take 250 mg by mouth daily.   metFORMIN 500 MG tablet Commonly known as: GLUCOPHAGE Take 1,000 mg by mouth 2 (two) times daily with a meal.   mupirocin ointment 2 % Commonly known as: BACTROBAN Apply 1 application topically 2 (two) times daily.   Nitrostat 0.4 MG SL tablet Generic drug: nitroGLYCERIN PLACE 1 TABLET UNDER THE TONGUE EVERY 5 MINUTES FOR CHEST PAIN,UP TO 3 TABLETS What changed: See the new instructions.   simvastatin 20 MG tablet Commonly known as: ZOCOR Take 20 mg by mouth daily at 6 PM.   Soliqua 100-33 UNT-MCG/ML  Sopn Generic drug: Insulin Glargine-Lixisenatide Inject 50 Units into the skin at bedtime.   spironolactone 25 MG tablet Commonly known as: ALDACTONE Take 25 mg by mouth daily.   tiZANidine 4 MG tablet Commonly known as: ZANAFLEX Take 1 tablet (4 mg total) by mouth every 6 (six) hours as needed for muscle spasms.   vitamin B-12 1000 MCG tablet Commonly known as: CYANOCOBALAMIN Take 1,000 mcg by mouth daily.   VITAMIN D PO Take 50 mcg by mouth daily.   vitamin E 180 MG (400 UNITS) capsule Take 400 Units by mouth daily.       Follow-up Information    14/08/2019, MD Follow up in 3 week(s).   Specialty: Neurosurgery Why: please call to make an appointment Contact information: [address] [place] [phone]               Signed: [phone] 07/11/2020, 1:05 PM

## 2020-07-11 NOTE — Progress Notes (Addendum)
Physical Therapy Treatment Patient Details Name: Charles Bonilla MRN: [id number] DOB: 16-Dec-1933 Today's Date: 07/11/2020    History of Present Illness Pt is an 86 y/o male who presents s/p L2-L3 microdiscectomy on 07/10/2020. PMH significant for HTN, DM, bipolar disorder, CAD, B TKA.     PT Comments    Pt progressing well with post-op mobility. He was able to demonstrate transfers and ambulation with gross supervision for safety and [facility] for support. Pt was educated on precautions, appropriate activity progression, and car transfer. Will continue to follow.      Follow Up Recommendations  No PT follow up;Supervision - Intermittent     Equipment Recommendations  None recommended by PT   Recommendations for Other Services       Precautions / Restrictions Precautions Precautions: Fall;Back Precaution Booklet Issued: Yes (comment) Precaution Comments: Reviewed precautions verbally and pt was cued for precautions during functional mobility.  Restrictions Weight Bearing Restrictions: No    Mobility  Bed Mobility               General bed mobility comments: Pt was received sitting up EOB. Verbally reviewed log roll technique with pt.   Transfers Overall transfer level: Needs assistance Equipment used: Straight cane Transfers: Sit to/from Stand Sit to Stand: Supervision         General transfer comment: Light supervision provided for power-up to full stand. Increased time.   Ambulation/Gait Ambulation/Gait assistance: Supervision Gait Distance (Feet): 200 Feet Assistive device: Straight cane Gait Pattern/deviations: Step-through pattern;Decreased stride length;Trunk flexed Gait velocity: decreased Gait velocity interpretation: <1.8 ft/sec, indicate of risk for recurrent falls General Gait Details: VC's for improved posture; good sequencing with SPC and no gross unsteadiness or LOB.    Stairs             Wheelchair Mobility    Modified Rankin (Stroke  Patients Only)       Balance Overall balance assessment: Mild deficits observed, not formally tested                                          Cognition Arousal/Alertness: Awake/alert Behavior During Therapy: WFL for tasks assessed/performed Overall Cognitive Status: Within Functional Limits for tasks assessed                                        Exercises      General Comments        Pertinent Vitals/Pain Pain Assessment: Faces Faces Pain Scale: Hurts a little bit Pain Location: incinsional/back discomfort  Pain Descriptors / Indicators: Discomfort Pain Intervention(s): Limited activity within patient's tolerance;Monitored during session;Repositioned    Home Living                      Prior Function            PT Goals (current goals can now be found in the care plan section) Acute Rehab PT Goals Patient Stated Goal: go home today PT Goal Formulation: With patient Time For Goal Achievement: 07/18/20 Potential to Achieve Goals: Good Progress towards PT goals: Progressing toward goals    Frequency    Min 5X/week      PT Plan Current plan remains appropriate    Co-evaluation  AM-PAC PT "6 Clicks" Mobility   Outcome Measure  Help needed turning from your back to your side while in a flat bed without using bedrails?: None Help needed moving from lying on your back to sitting on the side of a flat bed without using bedrails?: A Little Help needed moving to and from a bed to a chair (including a wheelchair)?: None Help needed standing up from a chair using your arms (e.g., wheelchair or bedside chair)?: A Little Help needed to walk in hospital room?: A Little Help needed climbing 3-5 steps with a railing? : A Little 6 Click Score: 20    End of Session Equipment Utilized During Treatment: Gait belt Activity Tolerance: Patient tolerated treatment well Patient left: in bed;with call bell/phone  within reach Nurse Communication: Mobility status PT Visit Diagnosis: Unsteadiness on feet (R26.81);Other abnormalities of gait and mobility (R26.89)     Time: 5956-3875 PT Time Calculation (min) (ACUTE ONLY): 14 min  Charges:  $Gait Training: 8-22 mins                     Conni Slipper, PT, DPT Acute Rehabilitation Services Pager: [phone] Office: [phone]    Charles Bonilla 07/11/2020, 1:24 PM

## 2020-07-11 NOTE — Evaluation (Signed)
Occupational Therapy Evaluation Patient Details Name: Charles Bonilla MRN: [id number] DOB: 1934/05/23 Today's Date: 07/11/2020    History of Present Illness has approximately a 3-week history of severe pain and weakness in his left lower extremity.  He has fallen 5 times at the very least during this period of time. Left Lumbar two-three microdiscectomy.  PMH OSA, HTN, DM, Bipolar disorder, CAD,  s/p bil. TKA    Clinical Impression   Patient evaluated by Occupational Therapy with no further acute OT needs identified. All education has been completed and the patient has no further questions. All education completed. See below for any follow-up Occupational Therapy or equipment needs. OT is signing off. Thank you for this referral.      Follow Up Recommendations  No OT follow up;Supervision - Intermittent    Equipment Recommendations       Recommendations for Other Services       Precautions / Restrictions Precautions Precautions: Fall;Back Precaution Booklet Issued: Yes (comment) Precaution Comments: h/o falls at home.  reviewed back precautions with him.  He requires min cues to avoid bending and twisting        Mobility Bed Mobility Overal bed mobility: Needs Assistance Bed Mobility: Rolling;Sidelying to Sit Rolling: Supervision Sidelying to sit: Supervision       General bed mobility comments: increased time and effort to log roll.  Reliant on bedrails     Transfers Overall transfer level: Needs assistance Equipment used: Straight cane Transfers: Sit to/from [employer] Sit to Stand: Supervision Stand pivot transfers: Supervision            Balance Overall balance assessment: Mild deficits observed, not formally tested                                         ADL either performed or assessed with clinical judgement   ADL Overall ADL's : Needs assistance/impaired Eating/Feeding: Independent   Grooming: Wash/dry  hands;Wash/dry face;Oral care;Supervision/safety;Standing Grooming Details (indicate cue type and reason): reviewed safe technique for grooming and reinforced no bending  Upper Body Bathing: Set up;Supervision/ safety;Sitting   Lower Body Bathing: Supervison/ safety;Sit to/from stand;With adaptive equipment   Upper Body Dressing : Set up;Sitting   Lower Body Dressing: Supervision/safety;Sit to/from stand;With adaptive equipment;Cueing for back precautions   Toilet Transfer: Supervision/safety;Ambulation;Comfort height toilet;Grab bars   Toileting- Clothing Manipulation and Hygiene: Supervision/safety;Sit to/from stand       Functional mobility during ADLs: Supervision/safety General ADL Comments: Pt demonstrates understanding of use of AE.  Requires cues to avoid bending and twisting      Vision Baseline Vision/History: Wears glasses Patient Visual Report: No change from baseline       Perception     Praxis      Pertinent Vitals/Pain Pain Assessment: Faces Faces Pain Scale: Hurts a little bit Pain Location: incinsional/back discomfort  Pain Descriptors / Indicators: Discomfort Pain Intervention(s): Monitored during session     Hand Dominance Right   Extremity/Trunk Assessment Upper Extremity Assessment Upper Extremity Assessment: Overall WFL for tasks assessed   Lower Extremity Assessment Lower Extremity Assessment: Defer to PT evaluation   Cervical / Trunk Assessment Cervical / Trunk Assessment: Other exceptions Cervical / Trunk Exceptions: s/p microdiscectomy.     Communication Communication Communication: No difficulties   Cognition Arousal/Alertness: Awake/alert Behavior During Therapy: WFL for tasks assessed/performed Overall Cognitive Status: Within Functional Limits for tasks assessed  General Comments  Encouraged pt to avoid sitting for longer than 30-45 mins at a time     Exercises     Shoulder  Instructions      Home Living Family/patient expects to be discharged to:: Private residence Living Arrangements: Spouse/significant other Available Help at Discharge: Family;Available 24 hours/day Type of Home: Independent living facility Home Access:  (3" threshold into house )     Home Layout: One level     Bathroom Shower/Tub: Producer, television/film/video: Standard Bathroom Accessibility: Yes   Home Equipment: Cane - single point;Shower seat;Grab bars - tub/shower;Adaptive equipment Adaptive Equipment: Reacher;Long-handled shoe horn;Long-handled sponge Additional Comments: Pt [facility] at [facility] with his wife       Prior Functioning/Environment Level of Independence: Independent with assistive device(s)        Comments: occasionally uses a SPC.  H/o falls.   Is sedentary by his report.  Is a retired Brewing technologist Problem List: Impaired balance (sitting and/or standing);Decreased knowledge of precautions;Pain      OT Treatment/Interventions:      OT Goals(Current goals can be found in the care plan section) Acute Rehab OT Goals Patient Stated Goal: go home today OT Goal Formulation: All assessment and education complete, DC therapy  OT Frequency:     Barriers to D/C:            Co-evaluation              AM-PAC OT "6 Clicks" Daily Activity     Outcome Measure Help from another person eating meals?: None Help from another person taking care of personal grooming?: A Little Help from another person toileting, which includes using toliet, bedpan, or urinal?: A Little Help from another person bathing (including washing, rinsing, drying)?: A Little Help from another person to put on and taking off regular upper body clothing?: A Little Help from another person to put on and taking off regular lower body clothing?: A Little 6 Click Score: 19   End of Session Equipment Utilized During Treatment: Other (comment) (SPC  ) Nurse Communication: Mobility status  Activity Tolerance: Patient tolerated treatment well Patient left: in bed;with call bell/phone within reach  OT Visit Diagnosis: Pain Pain - part of body:  (back discomfort )                Time: 5329-9242 OT Time Calculation (min): 33 min Charges:  OT General Charges $OT Visit: 1 Visit OT Evaluation $OT Eval Low Complexity: 1 Low OT Treatments $Self Care/Home Management : 8-22 mins  Eber Jones., OTR/L Acute Rehabilitation Services Pager [phone] Office [phone]   Jeani Hawking M 07/11/2020, 10:06 AM

## 2020-07-11 NOTE — Progress Notes (Signed)
Inpatient Diabetes Program Recommendations  AACE/ADA: New Consensus Statement on Inpatient Glycemic Control (2015)  Target Ranges:  Prepandial:   less than 140 mg/dL      Peak postprandial:   less than 180 mg/dL (1-2 hours)      Critically ill patients:  140 - 180 mg/dL   Lab Results  Component Value Date   GLUCAP 192 (H) 07/11/2020   HGBA1C 7.5 (H) 07/10/2020    Review of Glycemic Control Results for KERRION, KEMPPAINEN (MRN [id number]) as of 07/11/2020 11:16  Ref. Range 07/10/2020 12:09 07/10/2020 16:03 07/10/2020 21:13 07/11/2020 06:14  Glucose-Capillary Latest Ref Range: 70 - 99 mg/dL 333 (H) 545 (H) 625 (H) 192 (H)   Diabetes history: Type 2 DM Outpatient Diabetes medications: Soliqua 50 units QHS, Metformin 1000 mg BID Current orders for Inpatient glycemic control: Soliqua 50 units QHS, Lantus 50 units QHS, Metformin 1000 mg BID, Novolog 0-20 units TID Solumedrol 80 mg x 1, Decadron 5 mg x 1  Inpatient Diabetes Program Recommendations:    Noted duplicate orders for insulin for QHS dosing of Lantus. Please discontinue Soliqua 50 units QHS. Luckily, patient did not received both doses on 11/30, or would have been at risk for hypoglycemia. Secure chat sent to MD and RN.   Thanks, Lujean Rave, MSN, RNC-OB Diabetes Coordinator [phone] (8a-5p)<

## 2020-07-11 NOTE — Discharge Instructions (Signed)
Lumbar Discectomy °Care After °A discectomy involves removal of discmaterial (the cartilage-like structures located between the bones of the back). It is done to relieve pressure on nerve roots. It can be used as a treatment for a back problem. The time in surgery depends on the findings in surgery and what is necessary to correct the problems. °HOME CARE INSTRUCTIONS  °· Check the cut (incision) made by the surgeon twice a day for signs of infection. Some signs of infection may include:  °· A foul smelling, greenish or yellowish discharge from the wound.  °· Increased pain.  °· Increased redness over the incision (operative) site.  °· The skin edges may separate.  °· Flu-like symptoms (problems).  °· A temperature above 101.5° F (38.6° C).  °· Change your bandages in about 24 to 36 hours following surgery or as directed.  °· You may shower tomrrow.  Avoid bathtubs, swimming pools and hot tubs for three weeks or until your incision has healed completely. °· Follow your doctor's instructions as to safe activities, exercises, and physical therapy.  °· Weight reduction may be beneficial if you are overweight.  °· Daily exercise is helpful to prevent the return of problems. Walking is permitted. You may use a treadmill without an incline. Cut down on activities and exercise if you have discomfort. You may also go up and down stairs as much as you can tolerate.  °· DO NOT lift anything heavier than 10 to 15 lbs. Avoid bending or twisting at the waist. Always bend your knees when lifting.  °· Maintain strength and range of motion as instructed.  °· Do not drive for 10 days, or as directed by your doctors. You may be a passenger . Lying back in the passenger seat may be more comfortable for you. Always wear a seatbelt.  °· Limit your sitting in a regular chair to 20 to 30 minutes at a time. There are no limitations for sitting in a recliner. You should lie down or walk in between sitting periods.  °· Only take  over-the-counter or prescription medicines for pain, discomfort, or fever as directed by your caregiver.  °SEEK MEDICAL CARE IF:  °· There is increased bleeding (more than a small spot) from the wound.  °· You notice redness, swelling, or increasing pain in the wound.  °· Pus is coming from wound.  °· You develop an unexplained oral temperature above 102° F (38.9° C) develops.  °· You notice a foul smell coming from the wound or dressing.  °· You have increasing pain in your wound.  °SEEK IMMEDIATE MEDICAL CARE IF:  °· You develop a rash.  °· You have difficulty breathing.  °· You develop any allergic problems to medicines given.  °Document Released: 07/02/2004 Document Revised: 07/17/2011 Document Reviewed: 10/21/2007 °ExitCare® Patient Information °

## 2020-07-12 ENCOUNTER — Encounter (HOSPITAL_COMMUNITY): Payer: Self-pay | Admitting: Neurosurgery

## 2020-07-12 NOTE — Progress Notes (Signed)
Patient's wife, Aurea Graff called me today requesting that a diagnosis of Bipolar disorder that we have on patient in Epic be taken off because pt does not have Bipolar. She states pt does have a hx of depression that Dr. Kirby Funk follows for pt. I spoke with Cyndia Diver, RN and Maudry Diego, RN, Asst Director and we looked back to see where the diagnosis was first put in. It was put in by a RMA at a doctor's office in 2014. It has now been removed from pt's medical history and I notified Mrs. Consiglio that it is no longer on his medical record. She expressed much appreciation.

## 2020-07-27 DIAGNOSIS — M79671 Pain in right foot: Secondary | ICD-10-CM | POA: Diagnosis not present

## 2020-07-30 DIAGNOSIS — Z6841 Body Mass Index (BMI) 40.0 and over, adult: Secondary | ICD-10-CM | POA: Insufficient documentation

## 2020-07-31 ENCOUNTER — Ambulatory Visit (INDEPENDENT_AMBULATORY_CARE_PROVIDER_SITE_OTHER): Payer: Medicare HMO | Admitting: Podiatry

## 2020-07-31 ENCOUNTER — Other Ambulatory Visit: Payer: Self-pay

## 2020-07-31 DIAGNOSIS — E1142 Type 2 diabetes mellitus with diabetic polyneuropathy: Secondary | ICD-10-CM | POA: Diagnosis not present

## 2020-07-31 DIAGNOSIS — B351 Tinea unguium: Secondary | ICD-10-CM | POA: Diagnosis not present

## 2020-07-31 DIAGNOSIS — IMO0002 Reserved for concepts with insufficient information to code with codable children: Secondary | ICD-10-CM

## 2020-07-31 DIAGNOSIS — E1165 Type 2 diabetes mellitus with hyperglycemia: Secondary | ICD-10-CM

## 2020-07-31 DIAGNOSIS — M79674 Pain in right toe(s): Secondary | ICD-10-CM

## 2020-07-31 DIAGNOSIS — M79675 Pain in left toe(s): Secondary | ICD-10-CM | POA: Diagnosis not present

## 2020-08-01 ENCOUNTER — Ambulatory Visit: Payer: Medicare HMO | Admitting: Physical Therapy

## 2020-08-02 NOTE — Progress Notes (Signed)
Subjective: 86 y.o. returns the office today for painful, elongated, thickened toenails which he cannot trim himself. Denies any redness or drainage around the nails.  No open lesions he reports.  Denies any acute changes since last appointment and no new complaints today. Denies any systemic complaints such as fevers, chills, nausea, vomiting.   PCP: Kirby Funk, MD Last A1c- 7.5 07/10/2020  Objective: AAO 3, NAD DP/PT pulses palpable, CRT less than 3 seconds.  Nails hypertrophic, dystrophic, elongated, brittle, discolored 10. There is tenderness overlying the nails 1-5 bilaterally. There is no surrounding erythema or drainage along the nail sites. No open lesions or pre-ulcerative lesions are identified. No other areas of tenderness bilateral lower extremities. No overlying edema, erythema, increased warmth. No pain with calf compression, swelling, warmth, erythema.  Assessment: Patient presents with symptomatic onychomycosis  Plan: -Treatment options including alternatives, risks, complications were discussed -Nails sharply debrided 10 without complication/bleeding. -Discussed daily foot inspection. If there are any changes, to call the office immediately.  -Follow-up in 3 months or sooner if any problems are to arise. In the meantime, encouraged to call the office with any questions, concerns, changes symptoms.  Ovid Curd, DPM

## 2020-08-14 DIAGNOSIS — I2510 Atherosclerotic heart disease of native coronary artery without angina pectoris: Secondary | ICD-10-CM | POA: Diagnosis not present

## 2020-08-14 DIAGNOSIS — E1159 Type 2 diabetes mellitus with other circulatory complications: Secondary | ICD-10-CM | POA: Diagnosis not present

## 2020-08-14 DIAGNOSIS — Z7984 Long term (current) use of oral hypoglycemic drugs: Secondary | ICD-10-CM | POA: Diagnosis not present

## 2020-08-14 DIAGNOSIS — I5032 Chronic diastolic (congestive) heart failure: Secondary | ICD-10-CM | POA: Diagnosis not present

## 2020-08-14 DIAGNOSIS — I110 Hypertensive heart disease with heart failure: Secondary | ICD-10-CM | POA: Diagnosis not present

## 2020-08-14 DIAGNOSIS — Z794 Long term (current) use of insulin: Secondary | ICD-10-CM | POA: Diagnosis not present

## 2020-08-14 DIAGNOSIS — I10 Essential (primary) hypertension: Secondary | ICD-10-CM | POA: Diagnosis not present

## 2020-08-14 DIAGNOSIS — E1169 Type 2 diabetes mellitus with other specified complication: Secondary | ICD-10-CM | POA: Diagnosis not present

## 2020-08-29 DIAGNOSIS — E1169 Type 2 diabetes mellitus with other specified complication: Secondary | ICD-10-CM | POA: Diagnosis not present

## 2020-08-29 DIAGNOSIS — M179 Osteoarthritis of knee, unspecified: Secondary | ICD-10-CM | POA: Diagnosis not present

## 2020-08-29 DIAGNOSIS — I10 Essential (primary) hypertension: Secondary | ICD-10-CM | POA: Diagnosis not present

## 2020-08-29 DIAGNOSIS — E7800 Pure hypercholesterolemia, unspecified: Secondary | ICD-10-CM | POA: Diagnosis not present

## 2020-08-29 DIAGNOSIS — N182 Chronic kidney disease, stage 2 (mild): Secondary | ICD-10-CM | POA: Diagnosis not present

## 2020-08-29 DIAGNOSIS — E1165 Type 2 diabetes mellitus with hyperglycemia: Secondary | ICD-10-CM | POA: Diagnosis not present

## 2020-08-29 DIAGNOSIS — I2510 Atherosclerotic heart disease of native coronary artery without angina pectoris: Secondary | ICD-10-CM | POA: Diagnosis not present

## 2020-08-29 DIAGNOSIS — F325 Major depressive disorder, single episode, in full remission: Secondary | ICD-10-CM | POA: Diagnosis not present

## 2020-08-29 DIAGNOSIS — I5032 Chronic diastolic (congestive) heart failure: Secondary | ICD-10-CM | POA: Diagnosis not present

## 2020-09-04 DIAGNOSIS — I5032 Chronic diastolic (congestive) heart failure: Secondary | ICD-10-CM | POA: Diagnosis not present

## 2020-09-13 DIAGNOSIS — F325 Major depressive disorder, single episode, in full remission: Secondary | ICD-10-CM | POA: Diagnosis not present

## 2020-09-13 DIAGNOSIS — E7800 Pure hypercholesterolemia, unspecified: Secondary | ICD-10-CM | POA: Diagnosis not present

## 2020-09-13 DIAGNOSIS — I2510 Atherosclerotic heart disease of native coronary artery without angina pectoris: Secondary | ICD-10-CM | POA: Diagnosis not present

## 2020-09-13 DIAGNOSIS — I10 Essential (primary) hypertension: Secondary | ICD-10-CM | POA: Diagnosis not present

## 2020-09-13 DIAGNOSIS — I5032 Chronic diastolic (congestive) heart failure: Secondary | ICD-10-CM | POA: Diagnosis not present

## 2020-09-13 DIAGNOSIS — M179 Osteoarthritis of knee, unspecified: Secondary | ICD-10-CM | POA: Diagnosis not present

## 2020-09-13 DIAGNOSIS — N182 Chronic kidney disease, stage 2 (mild): Secondary | ICD-10-CM | POA: Diagnosis not present

## 2020-09-13 DIAGNOSIS — E1165 Type 2 diabetes mellitus with hyperglycemia: Secondary | ICD-10-CM | POA: Diagnosis not present

## 2020-09-13 DIAGNOSIS — E1169 Type 2 diabetes mellitus with other specified complication: Secondary | ICD-10-CM | POA: Diagnosis not present

## 2020-09-24 DIAGNOSIS — R609 Edema, unspecified: Secondary | ICD-10-CM | POA: Diagnosis not present

## 2020-09-26 ENCOUNTER — Other Ambulatory Visit: Payer: Self-pay

## 2020-09-26 ENCOUNTER — Encounter: Payer: Self-pay | Admitting: Dermatology

## 2020-09-26 ENCOUNTER — Ambulatory Visit: Payer: Medicare HMO | Admitting: Dermatology

## 2020-09-26 DIAGNOSIS — I872 Venous insufficiency (chronic) (peripheral): Secondary | ICD-10-CM | POA: Diagnosis not present

## 2020-09-26 MED ORDER — TRIAMCINOLONE ACETONIDE 0.5 % EX OINT: 1.0000 "application " | TOPICAL_OINTMENT | Freq: Every day | CUTANEOUS | 0 refills | Status: DC

## 2020-09-27 ENCOUNTER — Other Ambulatory Visit: Payer: Self-pay | Admitting: Internal Medicine

## 2020-09-27 DIAGNOSIS — R609 Edema, unspecified: Secondary | ICD-10-CM

## 2020-09-28 ENCOUNTER — Other Ambulatory Visit: Payer: Self-pay

## 2020-09-28 ENCOUNTER — Ambulatory Visit
Admission: RE | Admit: 2020-09-28 | Discharge: 2020-09-28 | Disposition: A | Discharge status: Home / Self Care | Payer: Medicare HMO | Source: Ambulatory Visit | Attending: Internal Medicine | Admitting: Internal Medicine

## 2020-09-28 DIAGNOSIS — M7989 Other specified soft tissue disorders: Secondary | ICD-10-CM | POA: Diagnosis not present

## 2020-09-28 DIAGNOSIS — R609 Edema, unspecified: Secondary | ICD-10-CM

## 2020-09-30 ENCOUNTER — Encounter: Payer: Self-pay | Admitting: Dermatology

## 2020-09-30 NOTE — Progress Notes (Signed)
   Follow-Up Visit   Subjective  Charles Bonilla is a 87 y.o. male who presents for the following: Skin Problem (Right lower leg, ankle & foot x 3 weeks- no pain, + itch  tx- none).  Rash and swelling right leg and foot Location:  Duration:  Quality:  Associated Signs/Symptoms: Modifying Factors:  Severity:  Timing: Context:   Objective  Well appearing patient in no apparent distress; mood and affect are within normal limits. Objective  Right Lower Leg - Anterior: Right leg with mixed mostly pitting but some nonpitting edema, some distal fibrosis, dermatitis, erythema plus hyperpigmentation. Negative Homans, no cords. I cannot palpate pedal pulses (radial pulse strong).    A focused examination was performed including Arms, legs, venous and arterial examinations.. Relevant physical exam findings are noted in the Assessment and Plan.   Assessment & Plan    Venous stasis dermatitis of right lower extremity Right Lower Leg - Anterior  0.5% triamcinolone under moist wrap may help the skin manifestations, he definitely needs noninvasive arterial plus venous vascular studies. He will contact his cardiologist or primary care doctor about this.  triamcinolone ointment (KENALOG) 0.5 % - Right Lower Leg - Anterior  Venous stasis dermatitis of left lower extremity Left Lower Leg - Anterior  Start new topical therapy and contact Dr. Valentina Lucks for venous testing of both legs.  triamcinolone ointment (KENALOG) 0.5 % - Left Lower Leg - Anterior      I, Janalyn Harder, MD, have reviewed all documentation for this visit.  The documentation on 09/30/20 for the exam, diagnosis, procedures, and orders are all accurate and complete.

## 2020-10-08 DIAGNOSIS — R609 Edema, unspecified: Secondary | ICD-10-CM | POA: Diagnosis not present

## 2020-10-08 DIAGNOSIS — H9193 Unspecified hearing loss, bilateral: Secondary | ICD-10-CM | POA: Diagnosis not present

## 2020-10-08 DIAGNOSIS — Z5181 Encounter for therapeutic drug level monitoring: Secondary | ICD-10-CM | POA: Diagnosis not present

## 2020-10-08 DIAGNOSIS — K5900 Constipation, unspecified: Secondary | ICD-10-CM | POA: Diagnosis not present

## 2020-10-29 ENCOUNTER — Ambulatory Visit: Payer: Medicare HMO | Admitting: Podiatry

## 2020-10-30 ENCOUNTER — Other Ambulatory Visit: Payer: Self-pay

## 2020-10-30 ENCOUNTER — Ambulatory Visit (INDEPENDENT_AMBULATORY_CARE_PROVIDER_SITE_OTHER): Payer: Medicare HMO | Admitting: Podiatry

## 2020-10-30 DIAGNOSIS — B351 Tinea unguium: Secondary | ICD-10-CM | POA: Diagnosis not present

## 2020-10-30 DIAGNOSIS — M79674 Pain in right toe(s): Secondary | ICD-10-CM | POA: Diagnosis not present

## 2020-10-30 DIAGNOSIS — E1149 Type 2 diabetes mellitus with other diabetic neurological complication: Secondary | ICD-10-CM

## 2020-10-30 DIAGNOSIS — M79675 Pain in left toe(s): Secondary | ICD-10-CM | POA: Diagnosis not present

## 2020-11-03 NOTE — Progress Notes (Signed)
Subjective: 87 y.o. returns the office today for painful, elongated, thickened toenails which he cannot trim himself. Denies any redness or drainage around the nails.  He has not noticed any ulcerations.  He states his blood sugar typically runs between 128-130.  Denies any systemic complaints such as fevers, chills, nausea, vomiting.   PCP: Kirby Funk, MD Last A1c- 7.5 07/10/2020  Objective: AAO 3, NAD DP/PT pulses palpable, CRT less than 3 seconds.  Nails hypertrophic, dystrophic, elongated, brittle, discolored 10. There is tenderness overlying the nails 1-5 bilaterally. There is no surrounding erythema or drainage along the nail sites. No open lesions or pre-ulcerative lesions are identified. No pain with calf compression, swelling, warmth, erythema.  Assessment: Patient presents with symptomatic onychomycosis  Plan: -Treatment options including alternatives, risks, complications were discussed -Nails sharply debrided 10 without complication/bleeding. -Discussed daily foot inspection. If there are any changes, to call the office immediately.  -Follow-up in 3 months or sooner if any problems are to arise. In the meantime, encouraged to call the office with any questions, concerns, changes symptoms.  Ovid Curd, DPM

## 2020-11-13 DIAGNOSIS — H903 Sensorineural hearing loss, bilateral: Secondary | ICD-10-CM | POA: Diagnosis not present

## 2020-11-29 DIAGNOSIS — N182 Chronic kidney disease, stage 2 (mild): Secondary | ICD-10-CM | POA: Diagnosis not present

## 2020-11-29 DIAGNOSIS — I5032 Chronic diastolic (congestive) heart failure: Secondary | ICD-10-CM | POA: Diagnosis not present

## 2020-11-29 DIAGNOSIS — E1140 Type 2 diabetes mellitus with diabetic neuropathy, unspecified: Secondary | ICD-10-CM | POA: Diagnosis not present

## 2020-11-29 DIAGNOSIS — F325 Major depressive disorder, single episode, in full remission: Secondary | ICD-10-CM | POA: Diagnosis not present

## 2020-11-29 DIAGNOSIS — I2510 Atherosclerotic heart disease of native coronary artery without angina pectoris: Secondary | ICD-10-CM | POA: Diagnosis not present

## 2020-11-29 DIAGNOSIS — I10 Essential (primary) hypertension: Secondary | ICD-10-CM | POA: Diagnosis not present

## 2020-11-29 DIAGNOSIS — E7800 Pure hypercholesterolemia, unspecified: Secondary | ICD-10-CM | POA: Diagnosis not present

## 2020-11-29 DIAGNOSIS — E1165 Type 2 diabetes mellitus with hyperglycemia: Secondary | ICD-10-CM | POA: Diagnosis not present

## 2020-11-29 DIAGNOSIS — E1121 Type 2 diabetes mellitus with diabetic nephropathy: Secondary | ICD-10-CM | POA: Diagnosis not present

## 2020-12-11 DIAGNOSIS — H903 Sensorineural hearing loss, bilateral: Secondary | ICD-10-CM | POA: Diagnosis not present

## 2020-12-25 ENCOUNTER — Other Ambulatory Visit: Payer: Self-pay

## 2020-12-25 ENCOUNTER — Ambulatory Visit: Payer: Medicare HMO | Attending: Internal Medicine

## 2020-12-25 DIAGNOSIS — R2689 Other abnormalities of gait and mobility: Secondary | ICD-10-CM | POA: Diagnosis not present

## 2020-12-25 DIAGNOSIS — M6281 Muscle weakness (generalized): Secondary | ICD-10-CM | POA: Diagnosis not present

## 2020-12-25 NOTE — Therapy (Signed)
[facility] [address] [place], [place], [address] Phone: [phone]   Fax:  [phone]  Physical Therapy Evaluation  Patient Details  Name: Charles Bonilla MRN: [id number] Date of Birth: 1933/09/05 Referring Provider (PT): Kirby Funk, MD   Encounter Date: 12/25/2020   PT End of Session - 12/25/20 1447    Visit Number 1    Number of Visits 17    Date for PT Re-Evaluation 02/19/21    Authorization Type [employer] MCR    Authorization Time Period Auth submitted    Progress Note Due on Visit 10    PT Start Time 1445    PT Stop Time 1528    PT Time Calculation (min) 43 min    Activity Tolerance Patient tolerated treatment well;No increased pain    Behavior During Therapy WFL for tasks assessed/performed           Past Medical History:  Diagnosis Date  . CAD (coronary artery disease)     PTCA and stent March 2003  . Constipation   . Coronary atherosclerosis of unspecified type of vessel, native or graft   . Depression   . Diabetes mellitus without complication (HCC)    type 2  . DJD (degenerative joint disease)    Dr. Luiz Blare  . Erectile dysfunction   . HTN (hypertension)   . Hyperlipidemia   . OSA (obstructive sleep apnea)    cpap- uses nightly  . Other testicular hypofunction     Past Surgical History:  Procedure Laterality Date  . CARDIAC CATHETERIZATION  2003  . COLONOSCOPY    . EYE SURGERY Bilateral    cataracts removed  . L knee replacement, [name]    . LUMBAR LAMINECTOMY/DECOMPRESSION MICRODISCECTOMY Left 07/10/2020   Procedure: Left Lumbar two-three microdiscectomy;  Surgeon: Coletta Memos, MD;  Location: [facility] OR;  Service: Neurosurgery;  Laterality: Left;  . R knee replacement, [name]    . WISDOM TOOTH EXTRACTION      There were no vitals filed for this visit.    Subjective Assessment - 12/25/20 1451    Subjective Pt is a 87 y/o M who presents to PT with reports of L LE weakness. He had L2-3  microdisectomy d/t increased weakness and pain on L LE with numerous falls for 3 weeks prior to surgery. Pt notes that he has had 6 falls in the last 6 months, always to the left side. Pt denies current pain down L LE and is frustrated by continued weakness.    Pertinent History L LE weakness leading to lumbar microdisectomy on 07/10/21    Limitations Walking    How long can you sit comfortably? indefinte    How long can you stand comfortably? 15-20 minutes    How long can you walk comfortably? 10 minutes    Patient Stated Goals Pt wants to improve L LE strength in order to improve mobility and decrease fall risk    Currently in Pain? No/denies              [facility] PT Assessment - 12/25/20 0001      Assessment   Medical Diagnosis R29.898 (ICD-10-CM) - Other symptoms and signs involving the musculoskeletal system    Referring Provider (PT) Kirby Funk, MD    Hand Dominance Right      Precautions   Precautions None      Restrictions   Weight Bearing Restrictions No      Balance Screen   Has the patient fallen in  the past 6 months Yes    How many times? 6 falls; always to L side    Has the patient had a decrease in activity level because of a fear of falling?  Yes    Is the patient reluctant to leave their home because of a fear of falling?  No      Home Environment   Living Environment Private residence    Living Arrangements Spouse/significant other    Type of Home House    Home Access Level entry    Home Layout One level    Home Equipment Cane - single point;Grab bars - toilet;Grab bars - tub/shower      Prior Function   Level of Independence Independent;Independent with basic ADLs    Vocation Retired      CopyCognition   Overall Cognitive Status Within Functional Limits for tasks assessed      Observation/Other Assessments   Focus on Therapeutic Outcomes (FOTO)  55% function      Strength   Right Hip Flexion 5/5    Right Hip ABduction 5/5    Right Hip ADduction 5/5     Left Hip Flexion 4+/5    Left Hip ABduction 4+/5    Left Hip ADduction 4/5    Right Knee Flexion 5/5    Right Knee Extension 5/5    Left Knee Flexion 5/5    Left Knee Extension 4+/5    Right Ankle Dorsiflexion 5/5    Right Ankle Plantar Flexion 5/5    Left Ankle Dorsiflexion 5/5    Left Ankle Plantar Flexion 5/5      Transfers   Five time sit to stand comments  23 sec      Ambulation/Gait   Ambulation/Gait Yes    Ambulation/Gait Assistance 6: Modified independent (Device/Increase time)    Ambulation Distance (Feet) 75 Feet    Assistive device Straight cane    Gait Pattern Antalgic;Trendelenburg    Ambulation Surface Level                      Objective measurements completed on examination: See above findings.       [facility] - 12/25/20 0001      Exercises   Exercises Knee/Hip      Knee/Hip Exercises: Seated   Long Arc Quad 10 reps;Left    Sit to Sand 5 reps;without UE support      Knee/Hip Exercises: Supine   Hip Adduction Isometric 10 reps    Hip Adduction Isometric Limitations 5 sec hold    Straight Leg Raises 10 reps;Left    Other Supine Knee/Hip Exercises supine clamshell x 10 blue tband                  PT Education - 12/25/20 1811    Education Details eval findings, HEP, POC    Person(s) Educated Patient    Methods Explanation;Demonstration;Handout    Comprehension Verbalized understanding;Returned demonstration            PT Short Term Goals - 12/25/20 1812      PT SHORT TERM GOAL #1   Title Pt will be compliant and knolwedgeable with 90% of initial HEP in order to improve carryover    Baseline initial HEP given    Time 3    Period Weeks    Status New    Target Date 01/15/21             PT Long Term Goals -  12/25/20 1814      PT LONG TERM GOAL #1   Title Pt will improve FOTO function score to no less than 66% in order to improve confidence and functional ability    Baseline 57%    Time  8    Period Weeks    Status New    Target Date 02/19/21      PT LONG TERM GOAL #2   Title Pt will decrease 5xSTS time to no greater than 15 seconds in order to improve functional mobility and decrease fall risk    Baseline 23 seconds    Time 8    Period Weeks    Status New    Target Date 02/19/21      PT LONG TERM GOAL #3   Title Pt will increase all LE MMT to 5/5 for all tested motions in order to improve mobility    Baseline see flowsheet    Time 8    Period Weeks    Status New    Target Date 02/19/21                  Plan - 12/25/20 1817    Clinical Impression Statement Pt is a 87 y/o M who presents to PT with reports of L LE weakness. Physical findings are consistent with surgery and recovery timeline, with pt demonstrating slight decrease in L LE strength compared to R LE. His 5xSTS time indicates fall risk and decrease and functional mobility. Likewise, pt's FOTO score indicates decrease in functional ability and shows he is operating below PLOF. Pt would benefit from skilled PT services to address LE strength deficits and decrease fall risk.    Personal Factors and Comorbidities Comorbidity 3+    Comorbidities OSA, HTN, DM, Bipolar disorder, CAD,  s/p bil. TKA    Examination-Activity Limitations Squat;Stairs;Stand    Examination-Participation Restrictions Yard Work;Shop;Community Activity    Stability/Clinical Decision Making Stable/Uncomplicated    Clinical Decision Making Low    Rehab Potential Excellent    PT Frequency 2x / week    PT Duration 8 weeks    PT Treatment/Interventions ADLs/Self Care Home Management;Electrical Stimulation;Moist Heat;Cryotherapy;Gait training;Stair training;Functional mobility training;Therapeutic activities;Therapeutic exercise;Balance training;Neuromuscular re-education;Patient/family education;Manual techniques    PT Next Visit Plan assess gait and balance with funcitonal measure such as ; assess response to HEP, progress as able     PT Home Exercise Plan Access Code: [email or web address]    Consulted and Agree with Plan of Care Patient           Patient will benefit from skilled therapeutic intervention in order to improve the following deficits and impairments:  Abnormal gait,Decreased activity tolerance,Decreased endurance,Decreased range of motion,Decreased strength  Visit Diagnosis: Muscle weakness (generalized)  Other abnormalities of gait and mobility     Problem List Patient Active Problem List   Diagnosis Date Noted  . HNP (herniated nucleus pulposus), lumbar 07/10/2020  . Right bundle branch block (RBBB) with left anterior hemiblock 06/22/2014  . Hyperlipidemia   . HTN (hypertension)   . Erectile dysfunction   . Depressed bipolar disorder (HCC)   . OSA (obstructive sleep apnea)   . CAD (coronary artery disease)   . DJD (degenerative joint disease)     Eloy End, PT, DPT 12/25/20 6:22 PM  [facility] [address] [place], [place], [address] Phone: [phone]   Fax:  [phone]  Name: Charles Bonilla MRN: [id number] Date of Birth: Aug 08, 1934   Referring diagnosis? R29.898 (  ICD-10-CM) - Other symptoms and signs involving the musculoskeletal system Treatment diagnosis? (if different than referring diagnosis)   Muscle weakness (generalized)  Other abnormalities of gait and mobility  What was this (referring dx) caused by? [x]  Surgery []  Fall []  Ongoing issue []  Arthritis []  Other: ____________  Laterality: []  Rt [x]  Lt []  Both  Check all possible CPT codes:      [x]  97110 (Therapeutic Exercise)  []  92507 (SLP Treatment)  [x]  97112 (Neuro Re-ed)   []  92526 (Swallowing Treatment)   [x]  97116 (Gait Training)   []  (Cognitive Training, 1st 15 minutes) [x]  97140 (Manual Therapy)   []  97130 (Cognitive Training, each add'l 15 minutes)  [x]  97530 (Therapeutic Activities)  []  Other, List CPT Code ____________    [x]  97535 (Self  Care)       []  All codes above (97110 - 97535)  []  97012 (Mechanical Traction)  []  97014 (E-stim Unattended)  []  97032 (E-stim manual)  []  97033 (Ionto)  []  97035 (Ultrasound)  []  97760 (Orthotic Fit) []  (Physical Performance Training) []  (Aquatic Therapy) []  97034 (Contrast Bath) []  (Paraffin) []  97597 (Wound Care 1st 20 sq cm) []  97598 (Wound Care each add'l 20 sq cm) []  97016 (Vasopneumatic Device) []   ) []  (Prosthetic Training)

## 2020-12-26 NOTE — Addendum Note (Signed)
Addended by: Eloy End on: 12/26/2020 12:03 PM   Modules accepted: Orders

## 2020-12-31 ENCOUNTER — Other Ambulatory Visit: Payer: Self-pay

## 2020-12-31 ENCOUNTER — Ambulatory Visit: Payer: Medicare HMO

## 2020-12-31 DIAGNOSIS — R2689 Other abnormalities of gait and mobility: Secondary | ICD-10-CM | POA: Diagnosis not present

## 2020-12-31 DIAGNOSIS — M6281 Muscle weakness (generalized): Secondary | ICD-10-CM

## 2020-12-31 NOTE — Therapy (Signed)
[facility] [address] [place], [place], [address] Phone: [phone]   Fax:  [phone]  Physical Therapy Treatment  Patient Details  Name: Charles Bonilla MRN: [id number] Date of Birth: 14-Feb-1934 Referring Provider (PT): Kirby Funk, MD   Encounter Date: 12/31/2020   PT End of Session - 12/31/20 1417    Visit Number 2    Number of Visits 17    Date for PT Re-Evaluation 02/19/21    Authorization Type [employer] MCR    Authorization Time Period 5/23-8/13    Authorization - Visit Number 1    Authorization - Number of Visits 16    Progress Note Due on Visit 10    PT Start Time 1417    PT Stop Time 1456    PT Time Calculation (min) 39 min    Activity Tolerance Patient tolerated treatment well;No increased pain    Behavior During Therapy WFL for tasks assessed/performed           Past Medical History:  Diagnosis Date  . CAD (coronary artery disease)     PTCA and stent March 2003  . Constipation   . Coronary atherosclerosis of unspecified type of vessel, native or graft   . Depression   . Diabetes mellitus without complication (HCC)    type 2  . DJD (degenerative joint disease)    Dr. Luiz Blare  . Erectile dysfunction   . HTN (hypertension)   . Hyperlipidemia   . OSA (obstructive sleep apnea)    cpap- uses nightly  . Other testicular hypofunction     Past Surgical History:  Procedure Laterality Date  . CARDIAC CATHETERIZATION  2003  . COLONOSCOPY    . EYE SURGERY Bilateral    cataracts removed  . L knee replacement, [name]    . LUMBAR LAMINECTOMY/DECOMPRESSION MICRODISCECTOMY Left 07/10/2020   Procedure: Left Lumbar two-three microdiscectomy;  Surgeon: Coletta Memos, MD;  Location: [facility] OR;  Service: Neurosurgery;  Laterality: Left;  . R knee replacement, [name]    . WISDOM TOOTH EXTRACTION      There were no vitals filed for this visit.   Subjective Assessment - 12/31/20 1419    Subjective "Not any  different, really." Patient reports compliance with HEP, but reports difficulty with sit to stand. No falls since last session.    Pertinent History L LE weakness leading to lumbar microdisectomy on 07/10/21    Limitations Walking    How long can you sit comfortably? indefinte    How long can you stand comfortably? 15-20 minutes    How long can you walk comfortably? 10 minutes    Patient Stated Goals Pt wants to improve L LE strength in order to improve mobility and decrease fall risk    Currently in Pain? No/denies              [facility] PT Assessment - 12/31/20 0001      6 minute walk test results    Aerobic Endurance Distance Walked 430                         [facility] Adult PT Treatment/Exercise - 12/31/20 0001      Knee/Hip Exercises: Stretches   Hip Flexor Stretch 60 seconds    Hip Flexor Stretch Limitations LLE      Knee/Hip Exercises: Seated   Long Arc Quad 10 reps    Long Arc Quad Weight 2 lbs.    Long [employer] Limitations x2;  bilateral    Sit to Sand 10 reps   x2     Knee/Hip Exercises: Supine   Hip Adduction Isometric 10 reps    Hip Adduction Isometric Limitations 5 sec hold    Straight Leg Raises 10 reps    Straight Leg Raises Limitations x2; 2 # bilateral    Other Supine Knee/Hip Exercises resisted hip abduction 2 x 10 blue band      Knee/Hip Exercises: Sidelying   Hip ABduction 10 reps    Hip ABduction Limitations x2; bilateral                    PT Short Term Goals - 12/31/20 1443      PT SHORT TERM GOAL #1   Title Pt will be compliant and knolwedgeable with 90% of initial HEP in order to improve carryover    Baseline cues for sit to stand    Time 3    Period Weeks    Status On-going    Target Date 01/15/21             PT Long Term Goals - 12/25/20 1814      PT LONG TERM GOAL #1   Title Pt will improve FOTO function score to no less than 66% in order to improve confidence and functional ability    Baseline 57%    Time 8     Period Weeks    Status New    Target Date 02/19/21      PT LONG TERM GOAL #2   Title Pt will decrease 5xSTS time to no greater than 15 seconds in order to improve functional mobility and decrease fall risk    Baseline 23 seconds    Time 8    Period Weeks    Status New    Target Date 02/19/21      PT LONG TERM GOAL #3   Title Pt will increase all LE MMT to 5/5 for all tested motions in order to improve mobility    Baseline see flowsheet    Time 8    Period Weeks    Status New    Target Date 02/19/21                 Plan - 12/31/20 1421    Clinical Impression Statement Patient was able to walk 430 ft during , which is significantly lower than the age related normative value. He demonstrates antalgic gait during the with a WBOS, limited hip flexion/extension bilaterally, and decreased push-off bilaterally. He uses his SPC intermittently during and was recommended to bring his other cane to next session as his cane today is too short.Reviewed HEP with patient requiring cues for appropritae setup/completion of sit to stand, otherwise demonstrates independence with initial HEP. Overall good tolerance to today's session focusing on BLE strengthening without reports of pain.    Personal Factors and Comorbidities Comorbidity 3+    Comorbidities OSA, HTN, DM, Bipolar disorder, CAD,  s/p bil. TKA    Examination-Activity Limitations Squat;Stairs;Stand    Examination-Participation Restrictions Yard Work;Shop;Community Activity    Stability/Clinical Decision Making Stable/Uncomplicated    Rehab Potential Excellent    PT Frequency 2x / week    PT Duration 8 weeks    PT Treatment/Interventions ADLs/Self Care Home Management;Electrical Stimulation;Moist Heat;Cryotherapy;Gait training;Stair training;Functional mobility training;Therapeutic activities;Therapeutic exercise;Balance training;Neuromuscular re-education;Patient/family education;Manual techniques    PT Next Visit  Plan assess gait and balance with funcitonal measure such as ; assess response to  HEP, progress as able    PT Home Exercise Plan Access Code: [email or web address]    Consulted and Agree with Plan of Care Patient           Patient will benefit from skilled therapeutic intervention in order to improve the following deficits and impairments:  Abnormal gait,Decreased activity tolerance,Decreased endurance,Decreased range of motion,Decreased strength  Visit Diagnosis: Muscle weakness (generalized)  Other abnormalities of gait and mobility     Problem List Patient Active Problem List   Diagnosis Date Noted  . HNP (herniated nucleus pulposus), lumbar 07/10/2020  . Right bundle branch block (RBBB) with left anterior hemiblock 06/22/2014  . Hyperlipidemia   . HTN (hypertension)   . Erectile dysfunction   . Depressed bipolar disorder (HCC)   . OSA (obstructive sleep apnea)   . CAD (coronary artery disease)   . DJD (degenerative joint disease)    Letitia Libra, PT, DPT, ATC 12/31/20 2:58 PM  [facility] [address] [place], [place], [address] Phone: [phone]   Fax:  [phone]  Name: DYER KLUG MRN: [id number] Date of Birth: 1934-07-06

## 2021-01-02 ENCOUNTER — Other Ambulatory Visit: Payer: Self-pay

## 2021-01-02 ENCOUNTER — Ambulatory Visit: Payer: Medicare HMO

## 2021-01-02 DIAGNOSIS — M6281 Muscle weakness (generalized): Secondary | ICD-10-CM | POA: Diagnosis not present

## 2021-01-02 DIAGNOSIS — R2689 Other abnormalities of gait and mobility: Secondary | ICD-10-CM

## 2021-01-02 NOTE — Therapy (Signed)
[facility] [address] [place], [place], [address] Phone: [phone]   Fax:  [phone]  Physical Therapy Treatment  Patient Details  Name: Charles Bonilla MRN: [id number] Date of Birth: 1933/12/21 Referring Provider (PT): Kirby Funk, MD   Encounter Date: 01/02/2021   PT End of Session - 01/02/21 1456    Visit Number 3    Number of Visits 17    Date for PT Re-Evaluation 02/19/21    Authorization Type [employer] MCR    Authorization Time Period 5/23-8/13    Authorization - Visit Number 2    Authorization - Number of Visits 16    Progress Note Due on Visit 10    PT Start Time 1458   patient late   PT Stop Time 1529    PT Time Calculation (min) 31 min    Activity Tolerance Patient tolerated treatment well;No increased pain    Behavior During Therapy WFL for tasks assessed/performed           Past Medical History:  Diagnosis Date  . CAD (coronary artery disease)     PTCA and stent March 2003  . Constipation   . Coronary atherosclerosis of unspecified type of vessel, native or graft   . Depression   . Diabetes mellitus without complication (HCC)    type 2  . DJD (degenerative joint disease)    Dr. Luiz Blare  . Erectile dysfunction   . HTN (hypertension)   . Hyperlipidemia   . OSA (obstructive sleep apnea)    cpap- uses nightly  . Other testicular hypofunction     Past Surgical History:  Procedure Laterality Date  . CARDIAC CATHETERIZATION  2003  . COLONOSCOPY    . EYE SURGERY Bilateral    cataracts removed  . L knee replacement, [name]    . LUMBAR LAMINECTOMY/DECOMPRESSION MICRODISCECTOMY Left 07/10/2020   Procedure: Left Lumbar two-three microdiscectomy;  Surgeon: Coletta Memos, MD;  Location: [facility] OR;  Service: Neurosurgery;  Laterality: Left;  . R knee replacement, [name]    . WISDOM TOOTH EXTRACTION      There were no vitals filed for this visit.   Subjective Assessment - 01/02/21 1500    Subjective  "Not doing as well as I deserve." He denies any pain and reports his HEP is going well.    Currently in Pain? No/denies                             [facility] Adult PT Treatment/Exercise - 01/02/21 0001      Ambulation/Gait   Ambulation/Gait Yes    Ambulation/Gait Assistance 6: Modified independent (Device/Increase time)    Ambulation Distance (Feet) 185 Feet    Assistive device Straight cane    Gait Comments gait training with appropriate cane height      Knee/Hip Exercises: Standing   Heel Raises 15 reps    Heel Raises Limitations x2    Hip Flexion 10 reps    Hip Flexion Limitations x 2    Hip Abduction 10 reps    Abduction Limitations x2    Other Standing Knee Exercises lateral step ups on airex 2 x 10      Knee/Hip Exercises: Seated   Sit to Sand 10 reps   x2                   PT Short Term Goals - 12/31/20 1443      PT SHORT TERM  GOAL #1   Title Pt will be compliant and knolwedgeable with 90% of initial HEP in order to improve carryover    Baseline cues for sit to stand    Time 3    Period Weeks    Status On-going    Target Date 01/15/21             PT Long Term Goals - 12/25/20 1814      PT LONG TERM GOAL #1   Title Pt will improve FOTO function score to no less than 66% in order to improve confidence and functional ability    Baseline 57%    Time 8    Period Weeks    Status New    Target Date 02/19/21      PT LONG TERM GOAL #2   Title Pt will decrease 5xSTS time to no greater than 15 seconds in order to improve functional mobility and decrease fall risk    Baseline 23 seconds    Time 8    Period Weeks    Status New    Target Date 02/19/21      PT LONG TERM GOAL #3   Title Pt will increase all LE MMT to 5/5 for all tested motions in order to improve mobility    Baseline see flowsheet    Time 8    Period Weeks    Status New    Target Date 02/19/21                 Plan - 01/02/21 1507    Clinical Impression  Statement Patient brought a different SPC to clinic today, though it was still a little short for his height. Utilized clinic's SPC set at appropriate height with patient feeling as though his elbow was too flexed and prefered his SPC.Since no improvements were noted with gait mechanics utilizing SPC set at appropriate height he was encouraged to continue using his [facility] that he brought to the clinic today at home. Began strengthening exercises in standing today with patient tolerating session well without reports of pain.    Personal Factors and Comorbidities Comorbidity 3+    Comorbidities OSA, HTN, DM, Bipolar disorder, CAD,  s/p bil. TKA    Examination-Activity Limitations Squat;Stairs;Stand    Examination-Participation Restrictions Yard Work;Shop;Community Activity    Stability/Clinical Decision Making Stable/Uncomplicated    Rehab Potential Excellent    PT Frequency 2x / week    PT Duration 8 weeks    PT Treatment/Interventions ADLs/Self Care Home Management;Electrical Stimulation;Moist Heat;Cryotherapy;Gait training;Stair training;Functional mobility training;Therapeutic activities;Therapeutic exercise;Balance training;Neuromuscular re-education;Patient/family education;Manual techniques    PT Next Visit Plan update HEP. progress standing strengthening, static balance    PT Home Exercise Plan Access Code: [email or web address]    Consulted and Agree with Plan of Care Patient           Patient will benefit from skilled therapeutic intervention in order to improve the following deficits and impairments:  Abnormal gait,Decreased activity tolerance,Decreased endurance,Decreased range of motion,Decreased strength  Visit Diagnosis: Muscle weakness (generalized)  Other abnormalities of gait and mobility     Problem List Patient Active Problem List   Diagnosis Date Noted  . HNP (herniated nucleus pulposus), lumbar 07/10/2020  . Right bundle branch block (RBBB) with left anterior hemiblock 06/22/2014   . Hyperlipidemia   . HTN (hypertension)   . Erectile dysfunction   . Depressed bipolar disorder (HCC)   . OSA (obstructive sleep apnea)   . CAD (coronary artery disease)   .  DJD (degenerative joint disease)    Letitia Libra, PT, DPT, ATC 01/02/21 3:31 PM  [facility] [address] [place], [place], [address] Phone: [phone]   Fax:  [phone]  Name: Charles Bonilla MRN: [id number] Date of Birth: [birth year]

## 2021-01-09 ENCOUNTER — Other Ambulatory Visit: Payer: Self-pay

## 2021-01-09 ENCOUNTER — Ambulatory Visit: Payer: Medicare HMO | Attending: Internal Medicine

## 2021-01-09 DIAGNOSIS — R2689 Other abnormalities of gait and mobility: Secondary | ICD-10-CM | POA: Diagnosis not present

## 2021-01-09 DIAGNOSIS — M6281 Muscle weakness (generalized): Secondary | ICD-10-CM | POA: Insufficient documentation

## 2021-01-09 NOTE — Therapy (Signed)
[facility] [address] [place], [place], [address] Phone: [phone]   Fax:  [phone]  Physical Therapy Treatment  Patient Details  Name: Charles Bonilla MRN: [id number] Date of Birth: 13-Aug-1933 Referring Provider (PT): Kirby Funk, MD   Encounter Date: 01/09/2021   PT End of Session - 01/09/21 1448    Visit Number 4    Number of Visits 17    Date for PT Re-Evaluation 02/19/21    Authorization Type [employer] MCR    Authorization Time Period 5/23-8/13    Authorization - Visit Number 3    Authorization - Number of Visits 16    Progress Note Due on Visit 10    PT Start Time 1448    PT Stop Time 1530    PT Time Calculation (min) 42 min    Activity Tolerance Patient tolerated treatment well;No increased pain    Behavior During Therapy WFL for tasks assessed/performed           Past Medical History:  Diagnosis Date  . CAD (coronary artery disease)     PTCA and stent March 2003  . Constipation   . Coronary atherosclerosis of unspecified type of vessel, native or graft   . Depression   . Diabetes mellitus without complication (HCC)    type 2  . DJD (degenerative joint disease)    Dr. Luiz Blare  . Erectile dysfunction   . HTN (hypertension)   . Hyperlipidemia   . OSA (obstructive sleep apnea)    cpap- uses nightly  . Other testicular hypofunction     Past Surgical History:  Procedure Laterality Date  . CARDIAC CATHETERIZATION  2003  . COLONOSCOPY    . EYE SURGERY Bilateral    cataracts removed  . L knee replacement, [name]    . LUMBAR LAMINECTOMY/DECOMPRESSION MICRODISCECTOMY Left 07/10/2020   Procedure: Left Lumbar two-three microdiscectomy;  Surgeon: Coletta Memos, MD;  Location: [facility] OR;  Service: Neurosurgery;  Laterality: Left;  . R knee replacement, [name]    . WISDOM TOOTH EXTRACTION      There were no vitals filed for this visit.   Subjective Assessment - 01/09/21 1452    Subjective "No pain, but just  don't have an energy after getting my shot for the bug yesterday."    Currently in Pain? No/denies                             [facility] Adult PT Treatment/Exercise - 01/09/21 0001      Bed Mobility   Bed Mobility Supine to Sit;Sit to Supine    Supine to Sit Set up assist    Sit to Supine Set up assist      Self-Care   Self-Care Other Self-Care Comments    Other Self-Care Comments  see patient education      Therapeutic Activites    Therapeutic Activities ADL's    ADL's see bed mobility      Knee/Hip Exercises: Aerobic   Nustep 5 minutes level 5 LE only      Knee/Hip Exercises: Seated   Sit to Sand 5 reps      Knee/Hip Exercises: Supine   Bridges 10 reps    Bridges Limitations x2    Straight Leg Raises 15 reps    Straight Leg Raises Limitations x2; bilateral      Knee/Hip Exercises: Sidelying   Hip ABduction 10 reps    Hip ABduction Limitations x2; bilateral  PT Education - 01/09/21 1516    Education Details Education on proper posture, sleep positioning, and body mechanics with daily tasks    Person(s) Educated Patient    Methods Explanation;Verbal cues;Handout;Demonstration    Comprehension Verbalized understanding            PT Short Term Goals - 12/31/20 1443      PT SHORT TERM GOAL #1   Title Pt will be compliant and knolwedgeable with 90% of initial HEP in order to improve carryover    Baseline cues for sit to stand    Time 3    Period Weeks    Status On-going    Target Date 01/15/21             PT Long Term Goals - 12/25/20 1814      PT LONG TERM GOAL #1   Title Pt will improve FOTO function score to no less than 66% in order to improve confidence and functional ability    Baseline 57%    Time 8    Period Weeks    Status New    Target Date 02/19/21      PT LONG TERM GOAL #2   Title Pt will decrease 5xSTS time to no greater than 15 seconds in order to improve functional mobility and decrease fall  risk    Baseline 23 seconds    Time 8    Period Weeks    Status New    Target Date 02/19/21      PT LONG TERM GOAL #3   Title Pt will increase all LE MMT to 5/5 for all tested motions in order to improve mobility    Baseline see flowsheet    Time 8    Period Weeks    Status New    Target Date 02/19/21                 Plan - 01/09/21 1517    Clinical Impression Statement Patient arrives without reports of pain, though feels like he is lacking energy due to receiving vaccination yesterday. Patient requested to work on bed mobility as this is somewhat difficult for him to perform at home. With continued practice and cueing patient was able to perform proper supine <>sit transfer. Continued with BLE strengthening with patient tolerating strengthening well requiring intermittent cues for pacing and to breathe while completing exercises.    Personal Factors and Comorbidities Comorbidity 3+    Comorbidities OSA, HTN, DM, Bipolar disorder, CAD,  s/p bil. TKA    Examination-Activity Limitations Squat;Stairs;Stand    Examination-Participation Restrictions Yard Work;Shop;Community Activity    Stability/Clinical Decision Making Stable/Uncomplicated    Rehab Potential Excellent    PT Frequency 2x / week    PT Duration 8 weeks    PT Treatment/Interventions ADLs/Self Care Home Management;Electrical Stimulation;Moist Heat;Cryotherapy;Gait training;Stair training;Functional mobility training;Therapeutic activities;Therapeutic exercise;Balance training;Neuromuscular re-education;Patient/family education;Manual techniques    PT Next Visit Plan update HEP. progress standing strengthening, static balance    PT Home Exercise Plan Access Code: [email or web address]    Consulted and Agree with Plan of Care Patient           Patient will benefit from skilled therapeutic intervention in order to improve the following deficits and impairments:  Abnormal gait,Decreased activity tolerance,Decreased  endurance,Decreased range of motion,Decreased strength  Visit Diagnosis: Muscle weakness (generalized)  Other abnormalities of gait and mobility     Problem List Patient Active Problem List   Diagnosis Date Noted  . HNP (herniated nucleus  pulposus), lumbar 07/10/2020  . Right bundle branch block (RBBB) with left anterior hemiblock 06/22/2014  . Hyperlipidemia   . HTN (hypertension)   . Erectile dysfunction   . Depressed bipolar disorder (HCC)   . OSA (obstructive sleep apnea)   . CAD (coronary artery disease)   . DJD (degenerative joint disease)    Letitia Libra, PT, DPT, ATC 01/09/21 3:31 PM  [facility] Out[facility] [address] [place], [place], [address] Phone: [phone]   Fax:  [phone]  Name: Charles Bonilla MRN: [id number] Date of Birth: 1934-04-08

## 2021-01-09 NOTE — Patient Instructions (Signed)

## 2021-01-10 DIAGNOSIS — F325 Major depressive disorder, single episode, in full remission: Secondary | ICD-10-CM | POA: Diagnosis not present

## 2021-01-10 DIAGNOSIS — G4733 Obstructive sleep apnea (adult) (pediatric): Secondary | ICD-10-CM | POA: Diagnosis not present

## 2021-01-10 DIAGNOSIS — Z1389 Encounter for screening for other disorder: Secondary | ICD-10-CM | POA: Diagnosis not present

## 2021-01-10 DIAGNOSIS — E1121 Type 2 diabetes mellitus with diabetic nephropathy: Secondary | ICD-10-CM | POA: Diagnosis not present

## 2021-01-10 DIAGNOSIS — N182 Chronic kidney disease, stage 2 (mild): Secondary | ICD-10-CM | POA: Diagnosis not present

## 2021-01-10 DIAGNOSIS — Z0000 Encounter for general adult medical examination without abnormal findings: Secondary | ICD-10-CM | POA: Diagnosis not present

## 2021-01-10 DIAGNOSIS — I10 Essential (primary) hypertension: Secondary | ICD-10-CM | POA: Diagnosis not present

## 2021-01-10 DIAGNOSIS — Z794 Long term (current) use of insulin: Secondary | ICD-10-CM | POA: Diagnosis not present

## 2021-01-10 DIAGNOSIS — Z7984 Long term (current) use of oral hypoglycemic drugs: Secondary | ICD-10-CM | POA: Diagnosis not present

## 2021-01-10 DIAGNOSIS — Z6841 Body Mass Index (BMI) 40.0 and over, adult: Secondary | ICD-10-CM | POA: Diagnosis not present

## 2021-01-10 DIAGNOSIS — E7800 Pure hypercholesterolemia, unspecified: Secondary | ICD-10-CM | POA: Diagnosis not present

## 2021-01-11 ENCOUNTER — Other Ambulatory Visit: Payer: Self-pay

## 2021-01-11 ENCOUNTER — Ambulatory Visit: Payer: Medicare HMO

## 2021-01-11 DIAGNOSIS — M6281 Muscle weakness (generalized): Secondary | ICD-10-CM | POA: Diagnosis not present

## 2021-01-11 DIAGNOSIS — R2689 Other abnormalities of gait and mobility: Secondary | ICD-10-CM

## 2021-01-11 NOTE — Therapy (Signed)
[facility] [address] [place], [place], [address] Phone: [phone]   Fax:  [phone]  Physical Therapy Treatment  Patient Details  Name: Charles Bonilla MRN: [id number] Date of Birth: [birth year] Referring Provider (PT): Kirby Funk, MD   Encounter Date: 01/11/2021   PT End of Session - 01/11/21 1026    Visit Number 5    Number of Visits 17    Date for PT Re-Evaluation 02/19/21    Authorization Type [employer] MCR    Authorization Time Period 5/23-8/13    Authorization - Visit Number 4    Authorization - Number of Visits 16    Progress Note Due on Visit 10    PT Start Time 1027   patient late   PT Stop Time 1100    PT Time Calculation (min) 33 min    Activity Tolerance Patient tolerated treatment well    Behavior During Therapy [facility] for tasks assessed/performed           Past Medical History:  Diagnosis Date  . CAD (coronary artery disease)     PTCA and stent March 2003  . Constipation   . Coronary atherosclerosis of unspecified type of vessel, native or graft   . Depression   . Diabetes mellitus without complication (HCC)    type 2  . DJD (degenerative joint disease)    Dr. Luiz Blare  . Erectile dysfunction   . HTN (hypertension)   . Hyperlipidemia   . OSA (obstructive sleep apnea)    cpap- uses nightly  . Other testicular hypofunction     Past Surgical History:  Procedure Laterality Date  . CARDIAC CATHETERIZATION  2003  . COLONOSCOPY    . EYE SURGERY Bilateral    cataracts removed  . L knee replacement, [name]    . LUMBAR LAMINECTOMY/DECOMPRESSION MICRODISCECTOMY Left 07/10/2020   Procedure: Left Lumbar two-three microdiscectomy;  Surgeon: Coletta Memos, MD;  Location: [facility] OR;  Service: Neurosurgery;  Laterality: Left;  . R knee replacement, [name]    . WISDOM TOOTH EXTRACTION      There were no vitals filed for this visit.   Subjective Assessment - 01/11/21 1030    Subjective Patient reports he is  doing well today, just tired. He reports compliance with his HEP and feels his exercises are getting easier. No falls.    Currently in Pain? No/denies              [facility] PT Assessment - 01/11/21 0001      Transfers   Five time sit to stand comments  24 seconds                         [facility] Adult PT Treatment/Exercise - 01/11/21 0001      Self-Care   Other Self-Care Comments  see patient education      Neuro Re-ed    Neuro Re-ed Details  semi-tandem 2 x 20 sec each      Knee/Hip Exercises: Aerobic   Nustep 5 minutes level 5 LE only      Knee/Hip Exercises: Standing   Heel Raises 15 reps    Heel Raises Limitations x2    Hip Abduction 10 reps    Abduction Limitations x2; 2 lbs    Hip Extension 10 reps    Extension Limitations x2; 2 lbs    Other Standing Knee Exercises march with 2lbs 2 x 10  PT Education - 01/11/21 1044    Education Details Updated HEP.    Person(s) Educated Patient    Methods Explanation;Demonstration;Verbal cues;Handout    Comprehension Verbalized understanding;Returned demonstration;Verbal cues required            PT Short Term Goals - 12/31/20 1443      PT SHORT TERM GOAL #1   Title Pt will be compliant and knolwedgeable with 90% of initial HEP in order to improve carryover    Baseline cues for sit to stand    Time 3    Period Weeks    Status On-going    Target Date 01/15/21             PT Long Term Goals - 12/25/20 1814      PT LONG TERM GOAL #1   Title Pt will improve FOTO function score to no less than 66% in order to improve confidence and functional ability    Baseline 57%    Time 8    Period Weeks    Status New    Target Date 02/19/21      PT LONG TERM GOAL #2   Title Pt will decrease 5xSTS time to no greater than 15 seconds in order to improve functional mobility and decrease fall risk    Baseline 23 seconds    Time 8    Period Weeks    Status New    Target Date 02/19/21       PT LONG TERM GOAL #3   Title Pt will increase all LE MMT to 5/5 for all tested motions in order to improve mobility    Baseline see flowsheet    Time 8    Period Weeks    Status New    Target Date 02/19/21                 Plan - 01/11/21 1037    Clinical Impression Statement Session was limited as patient was late for his appointment. Patient arrives without reports of pain and reports that his HEP is getting easier. His 5xSTS test remains relatively unchanged compared to initial evaluation, likely due to ongoing LLE weakness. Continued with standing strengthening and began balance tranining. He is challenged in maintaining balance with tandem stance with LLE in rear. Occasional seated rest breaks required throughout session, though overall good tolerance to today's session without reports of pain.    Personal Factors and Comorbidities Comorbidity 3+    Comorbidities OSA, HTN, DM, Bipolar disorder, CAD,  s/p bil. TKA    Examination-Activity Limitations Squat;Stairs;Stand    Examination-Participation Restrictions Yard Work;Shop;Community Activity    Stability/Clinical Decision Making Stable/Uncomplicated    Rehab Potential Excellent    PT Frequency 2x / week    PT Duration 8 weeks    PT Treatment/Interventions ADLs/Self Care Home Management;Electrical Stimulation;Moist Heat;Cryotherapy;Gait training;Stair training;Functional mobility training;Therapeutic activities;Therapeutic exercise;Balance training;Neuromuscular re-education;Patient/family education;Manual techniques    PT Next Visit Plan HS curl, LAQ on machine. progress standing strengthening, static balance    PT Home Exercise Plan Access Code: [email or web address]    Consulted and Agree with Plan of Care Patient           Patient will benefit from skilled therapeutic intervention in order to improve the following deficits and impairments:  Abnormal gait,Decreased activity tolerance,Decreased endurance,Decreased range of  motion,Decreased strength  Visit Diagnosis: Muscle weakness (generalized)  Other abnormalities of gait and mobility     Problem List Patient Active Problem List   Diagnosis Date Noted  .  HNP (herniated nucleus pulposus), lumbar 07/10/2020  . Right bundle branch block (RBBB) with left anterior hemiblock 06/22/2014  . Hyperlipidemia   . HTN (hypertension)   . Erectile dysfunction   . Depressed bipolar disorder (HCC)   . OSA (obstructive sleep apnea)   . CAD (coronary artery disease)   . DJD (degenerative joint disease)    Letitia Libra, PT, DPT, ATC 01/11/21 12:02 PM  [facility] [address] [place], [place], [address] Phone: [phone]   Fax:  [phone]  Name: Charles Bonilla MRN: [id number] Date of Birth: 10-31-1933

## 2021-01-14 ENCOUNTER — Ambulatory Visit: Payer: Medicare HMO

## 2021-01-14 ENCOUNTER — Other Ambulatory Visit: Payer: Self-pay

## 2021-01-14 DIAGNOSIS — R2689 Other abnormalities of gait and mobility: Secondary | ICD-10-CM

## 2021-01-14 DIAGNOSIS — M6281 Muscle weakness (generalized): Secondary | ICD-10-CM | POA: Diagnosis not present

## 2021-01-14 NOTE — Therapy (Signed)
[name] [address] [place], [place], [address] Phone: [phone]   Fax:  [phone]  Physical Therapy Treatment  Patient Details  Name: Charles Bonilla MRN: [id number] Date of Birth: May 30, 1934 Referring Provider (PT): Kirby Funk, MD   Encounter Date: 01/14/2021   PT End of Session - 01/14/21 1508    Visit Number 6    Number of Visits 17    Date for PT Re-Evaluation 02/19/21    Authorization Type [employer] MCR    Authorization Time Period 5/23-8/13    Authorization - Visit Number 5    Authorization - Number of Visits 16    Progress Note Due on Visit 10    PT Start Time 1502    PT Stop Time 1543    PT Time Calculation (min) 41 min    Activity Tolerance Patient tolerated treatment well    Behavior During Therapy [facility] for tasks assessed/performed           Past Medical History:  Diagnosis Date  . CAD (coronary artery disease)     PTCA and stent March 2003  . Constipation   . Coronary atherosclerosis of unspecified type of vessel, native or graft   . Depression   . Diabetes mellitus without complication (HCC)    type 2  . DJD (degenerative joint disease)    Dr. Luiz Blare  . Erectile dysfunction   . HTN (hypertension)   . Hyperlipidemia   . OSA (obstructive sleep apnea)    cpap- uses nightly  . Other testicular hypofunction     Past Surgical History:  Procedure Laterality Date  . CARDIAC CATHETERIZATION  2003  . COLONOSCOPY    . EYE SURGERY Bilateral    cataracts removed  . L knee replacement, [name]    . LUMBAR LAMINECTOMY/DECOMPRESSION MICRODISCECTOMY Left 07/10/2020   Procedure: Left Lumbar two-three microdiscectomy;  Surgeon: Coletta Memos, MD;  Location: [facility] OR;  Service: Neurosurgery;  Laterality: Left;  . R knee replacement, [name]    . WISDOM TOOTH EXTRACTION      There were no vitals filed for this visit.   Subjective Assessment - 01/14/21 1507    Subjective "Not doing as well as I deserve. No  pain. I was tired after last session."    Currently in Pain? No/denies              [facility] PT Assessment - 01/14/21 0001      Observation/Other Assessments   Focus on Therapeutic Outcomes (FOTO)  64%                         [facility] Adult PT Treatment/Exercise - 01/14/21 0001      Self-Care   Other Self-Care Comments  see patient education      Knee/Hip Exercises: Aerobic   Nustep 5 minutes level 6; LE only      Knee/Hip Exercises: Machines for Strengthening   Cybex Knee Flexion 2 x 10; 45 lbs      Knee/Hip Exercises: Standing   Forward Step Up 10 reps    Forward Step Up Limitations 4 inch step tap 2 x 10 bilateral    Functional Squat 10 reps    Functional Squat Limitations x2; with UE support      Knee/Hip Exercises: Supine   Bridges 10 reps    Bridges Limitations x2                  PT Education -  01/14/21 1554    Education Details FOTO score and progress.    Person(s) Educated Patient    Methods Explanation    Comprehension Verbalized understanding            PT Short Term Goals - 12/31/20 1443      PT SHORT TERM GOAL #1   Title Pt will be compliant and knolwedgeable with 90% of initial HEP in order to improve carryover    Baseline cues for sit to stand    Time 3    Period Weeks    Status On-going    Target Date 01/15/21             PT Long Term Goals - 12/25/20 1814      PT LONG TERM GOAL #1   Title Pt will improve FOTO function score to no less than 66% in order to improve confidence and functional ability    Baseline 57%    Time 8    Period Weeks    Status New    Target Date 02/19/21      PT LONG TERM GOAL #2   Title Pt will decrease 5xSTS time to no greater than 15 seconds in order to improve functional mobility and decrease fall risk    Baseline 23 seconds    Time 8    Period Weeks    Status New    Target Date 02/19/21      PT LONG TERM GOAL #3   Title Pt will increase all LE MMT to 5/5 for all tested motions  in order to improve mobility    Baseline see flowsheet    Time 8    Period Weeks    Status New    Target Date 02/19/21                 Plan - 01/14/21 1536    Clinical Impression Statement Patient tolerated session well today without reports of pain. His FOTO outcome score has improved compared to baseline, nearing predicted score. Continued with BLE strengthening with patient having difficulty maintaining balance during step taps bilaterally (more difficulty maintaining stability with RLE as stance) requiring intermittent UE support. Occasional seated rest breaks required throughout session due to fatigue.    Personal Factors and Comorbidities Comorbidity 3+    Comorbidities OSA, HTN, DM, Bipolar disorder, CAD,  s/p bil. TKA    Examination-Activity Limitations Squat;Stairs;Stand    Examination-Participation Restrictions Yard Work;Shop;Community Activity    Stability/Clinical Decision Making Stable/Uncomplicated    Rehab Potential Excellent    PT Frequency 2x / week    PT Duration 8 weeks    PT Treatment/Interventions ADLs/Self Care Home Management;Electrical Stimulation;Moist Heat;Cryotherapy;Gait training;Stair training;Functional mobility training;Therapeutic activities;Therapeutic exercise;Balance training;Neuromuscular re-education;Patient/family education;Manual techniques    PT Next Visit Plan LAQ on machine. progress standing strengthening, static balance    PT Home Exercise Plan Access Code: [email or web address]    Consulted and Agree with Plan of Care Patient           Patient will benefit from skilled therapeutic intervention in order to improve the following deficits and impairments:  Abnormal gait,Decreased activity tolerance,Decreased endurance,Decreased range of motion,Decreased strength  Visit Diagnosis: Muscle weakness (generalized)  Other abnormalities of gait and mobility     Problem List Patient Active Problem List   Diagnosis Date Noted  . HNP (herniated  nucleus pulposus), lumbar 07/10/2020  . Right bundle branch block (RBBB) with left anterior hemiblock 06/22/2014  . Hyperlipidemia   . HTN (hypertension)   .  Erectile dysfunction   . Depressed bipolar disorder (HCC)   . OSA (obstructive sleep apnea)   . CAD (coronary artery disease)   . DJD (degenerative joint disease)    Letitia Libra, PT, DPT, ATC 01/14/21 3:56 PM  [facility] [address] [place], [place], [address] Phone: [phone]   Fax:  [phone]  Name: TAMARION HAYMOND MRN: [id number] Date of Birth: Feb 27, 1934

## 2021-01-16 ENCOUNTER — Ambulatory Visit: Payer: Medicare HMO

## 2021-01-16 ENCOUNTER — Other Ambulatory Visit: Payer: Self-pay

## 2021-01-16 DIAGNOSIS — R2689 Other abnormalities of gait and mobility: Secondary | ICD-10-CM

## 2021-01-16 DIAGNOSIS — M6281 Muscle weakness (generalized): Secondary | ICD-10-CM | POA: Diagnosis not present

## 2021-01-16 NOTE — Therapy (Signed)
[facility] Outpatient Rehabilitation [facility] [address] [place], [place], [address] Phone: [phone]   Fax:  [phone]  Physical Therapy Treatment  Patient Details  Name: Charles Bonilla MRN: [id number] Date of Birth: 1934/02/10 Referring Provider (PT): Kirby Funk, MD   Encounter Date: 01/16/2021   PT End of Session - 01/16/21 1444    Visit Number 7    Number of Visits 17    Date for PT Re-Evaluation 02/19/21    Authorization Type [employer] MCR    Authorization Time Period 5/23-8/13    Authorization - Visit Number 6    Authorization - Number of Visits 16    Progress Note Due on Visit 10    PT Start Time 1445    PT Stop Time 1529    PT Time Calculation (min) 44 min    Activity Tolerance Patient tolerated treatment well    Behavior During Therapy [facility] for tasks assessed/performed           Past Medical History:  Diagnosis Date  . CAD (coronary artery disease)     PTCA and stent March 2003  . Constipation   . Coronary atherosclerosis of unspecified type of vessel, native or graft   . Depression   . Diabetes mellitus without complication (HCC)    type 2  . DJD (degenerative joint disease)    Dr. Luiz Blare  . Erectile dysfunction   . HTN (hypertension)   . Hyperlipidemia   . OSA (obstructive sleep apnea)    cpap- uses nightly  . Other testicular hypofunction     Past Surgical History:  Procedure Laterality Date  . CARDIAC CATHETERIZATION  2003  . COLONOSCOPY    . EYE SURGERY Bilateral    cataracts removed  . L knee replacement, [name]    . LUMBAR LAMINECTOMY/DECOMPRESSION MICRODISCECTOMY Left 07/10/2020   Procedure: Left Lumbar two-three microdiscectomy;  Surgeon: Coletta Memos, MD;  [facility]: [facility] OR;  Service: Neurosurgery;  Laterality: Left;  . R knee replacement, [name]    . WISDOM TOOTH EXTRACTION      There were no vitals filed for this visit.   Subjective Assessment - 01/16/21 1448    Subjective Patient reports he is doing well.  Some soreness after last session that subsided by the time he was home. He denies any pain.    Currently in Pain? No/denies              [facility] PT Assessment - 01/16/21 0001      6 minute walk test results    Aerobic Endurance Distance Walked 531                         [facility] Adult PT Treatment/Exercise - 01/16/21 0001      Knee/Hip Exercises: Aerobic   Nustep 5 minutes level 6; LE only      Knee/Hip Exercises: Standing   Forward Step Up 10 reps    Forward Step Up Limitations 6 inch step taps x2 bilateral      Knee/Hip Exercises: Seated   Long Arc Quad Weight 5 lbs.    Long [employer] Limitations 2 x 15; bilateral      Knee/Hip Exercises: Sidelying   Clams 2 x 15 blue band bilateral                    PT Short Term Goals - 01/16/21 1543      PT SHORT TERM GOAL #1   Title  Pt will be compliant and knolwedgeable with 90% of initial HEP in order to improve carryover    Baseline no issues with initial HEP    Time 3    Period Weeks    Status Achieved    Target Date 01/15/21             PT Long Term Goals - 12/25/20 1814      PT LONG TERM GOAL #1   Title Pt will improve FOTO function score to no less than 66% in order to improve confidence and functional ability    Baseline 57%    Time 8    Period Weeks    Status New    Target Date 02/19/21      PT LONG TERM GOAL #2   Title Pt will decrease 5xSTS time to no greater than 15 seconds in order to improve functional mobility and decrease fall risk    Baseline 23 seconds    Time 8    Period Weeks    Status New    Target Date 02/19/21      PT LONG TERM GOAL #3   Title Pt will increase all LE MMT to 5/5 for all tested motions in order to improve mobility    Baseline see flowsheet    Time 8    Period Weeks    Status New    Target Date 02/19/21                 Plan - 01/16/21 1450    Clinical Impression Statement Patient tolerated sesion well today without reports of pain. He  demonstrates improved stability with step taps on 6 inch step requiring intemittent use fo UE support when stepping with the LLE during the first set, though with second set no LOB or need for UE support when stepping with the LLE. His 6 MWT has improved compared to previous assessment with patient able to walk for 531 ft without need for any rest break throughout duration of test. He reported muscle fatigue at end of session.    Personal Factors and Comorbidities Comorbidity 3+    Comorbidities OSA, HTN, DM, Bipolar disorder, CAD,  s/p bil. TKA    Examination-Activity Limitations Squat;Stairs;Stand    Examination-Participation Restrictions Yard Work;Shop;Community Activity    Stability/Clinical Decision Making Stable/Uncomplicated    Rehab Potential Excellent    PT Frequency 2x / week    PT Duration 8 weeks    PT Treatment/Interventions ADLs/Self Care Home Management;Electrical Stimulation;Moist Heat;Cryotherapy;Gait training;Stair training;Functional mobility training;Therapeutic activities;Therapeutic exercise;Balance training;Neuromuscular re-education;Patient/family education;Manual techniques    PT Next Visit Plan LAQ on machine. progress standing strengthening, static balance    PT Home Exercise Plan Access Code: [email or web address]    Consulted and Agree with Plan of Care Patient           Patient will benefit from skilled therapeutic intervention in order to improve the following deficits and impairments:  Abnormal gait,Decreased activity tolerance,Decreased endurance,Decreased range of motion,Decreased strength  Visit Diagnosis: Muscle weakness (generalized)  Other abnormalities of gait and mobility     Problem List Patient Active Problem List   Diagnosis Date Noted  . HNP (herniated nucleus pulposus), lumbar 07/10/2020  . Right bundle branch block (RBBB) with left anterior hemiblock 06/22/2014  . Hyperlipidemia   . HTN (hypertension)   . Erectile dysfunction   . Depressed bipolar  disorder (HCC)   . OSA (obstructive sleep apnea)   . CAD (coronary artery disease)   . DJD (degenerative  joint disease)    Letitia Libra, PT, DPT, ATC 01/16/21 3:45 PM  [facility] [address] [place], [place], [address] Phone: [phone]   Fax:  [phone]  Name: Charles Bonilla MRN: [id number] Date of Birth: 10-08-1933

## 2021-01-25 DIAGNOSIS — H5203 Hypermetropia, bilateral: Secondary | ICD-10-CM | POA: Diagnosis not present

## 2021-01-25 DIAGNOSIS — E119 Type 2 diabetes mellitus without complications: Secondary | ICD-10-CM | POA: Diagnosis not present

## 2021-01-25 DIAGNOSIS — Z961 Presence of intraocular lens: Secondary | ICD-10-CM | POA: Diagnosis not present

## 2021-01-28 ENCOUNTER — Ambulatory Visit: Payer: Medicare HMO

## 2021-01-28 ENCOUNTER — Other Ambulatory Visit: Payer: Self-pay

## 2021-01-28 DIAGNOSIS — M6281 Muscle weakness (generalized): Secondary | ICD-10-CM | POA: Diagnosis not present

## 2021-01-28 DIAGNOSIS — R2689 Other abnormalities of gait and mobility: Secondary | ICD-10-CM

## 2021-01-28 NOTE — Therapy (Signed)
[facility] Outpatient Rehabilitation [facility] [address] [place], [place], [address] Phone: [phone]   Fax:  [phone]  Physical Therapy Treatment  Patient Details  Name: Charles Bonilla MRN: [id number] Date of Birth: 1934/06/30 Referring Provider (PT): Kirby Funk, MD   Encounter Date: 01/28/2021   PT End of Session - 01/28/21 1503     Visit Number 8    Number of Visits 17    Date for PT Re-Evaluation 02/19/21    Authorization Type [employer] MCR    Authorization Time Period 5/23-8/13    Authorization - Visit Number 7    Authorization - Number of Visits 16    Progress Note Due on Visit 10    PT Start Time 1502    PT Stop Time 1544    PT Time Calculation (min) 42 min    Activity Tolerance Patient tolerated treatment well    Behavior During Therapy [facility] for tasks assessed/performed             Past Medical History:  Diagnosis Date   CAD (coronary artery disease)     PTCA and stent March 2003   Constipation    Coronary atherosclerosis of unspecified type of vessel, native or graft    Depression    Diabetes mellitus without complication (HCC)    type 2   DJD (degenerative joint disease)    Dr. Luiz Blare   Erectile dysfunction    HTN (hypertension)    Hyperlipidemia    OSA (obstructive sleep apnea)    cpap- uses nightly   Other testicular hypofunction     Past Surgical History:  Procedure Laterality Date   CARDIAC CATHETERIZATION  2003   COLONOSCOPY     EYE SURGERY Bilateral    cataracts removed   L knee replacement, [name]     LUMBAR LAMINECTOMY/DECOMPRESSION MICRODISCECTOMY Left 07/10/2020   Procedure: Left Lumbar two-three microdiscectomy;  Surgeon: Coletta Memos, MD;  Location: [facility] OR;  Service: Neurosurgery;  Laterality: Left;   R knee replacement, [name]     WISDOM TOOTH EXTRACTION      There were no vitals filed for this visit.   Subjective Assessment - 01/28/21 1524     Subjective Patient reports he is doing well without  reports of pain. Has been consistent with HEP since last session.    Currently in Pain? No/denies                [facility] PT Assessment - 01/28/21 0001       Transfers   Five time sit to stand comments  16.6 seconds                           [facility] Adult PT Treatment/Exercise - 01/28/21 0001       Self-Care   Other Self-Care Comments  see patient education      Knee/Hip Exercises: Stretches   Hip Flexor Stretch 60 seconds    Hip Flexor Stretch Limitations bilateral      Knee/Hip Exercises: Aerobic   Nustep 5 minutes level 5 LE only      Knee/Hip Exercises: Seated   Hamstring Curl 15 reps    Hamstring Limitations x2; blue band      Knee/Hip Exercises: Supine   Bridges 15 reps    Bridges Limitations x2      Knee/Hip Exercises: Sidelying   Hip ABduction Limitations 2 x 12; bilateral 2#  PT Education - 01/28/21 1539     Education Details Updated HEP.    Person(s) Educated Patient    Methods Explanation;Verbal cues;Handout    Comprehension Verbalized understanding;Returned demonstration;Verbal cues required              PT Short Term Goals - 01/16/21 1543       PT SHORT TERM GOAL #1   Title Pt will be compliant and knolwedgeable with 90% of initial HEP in order to improve carryover    Baseline no issues with initial HEP    Time 3    Period Weeks    Status Achieved    Target Date 01/15/21               PT Long Term Goals - 12/25/20 1814       PT LONG TERM GOAL #1   Title Pt will improve FOTO function score to no less than 66% in order to improve confidence and functional ability    Baseline 57%    Time 8    Period Weeks    Status New    Target Date 02/19/21      PT LONG TERM GOAL #2   Title Pt will decrease 5xSTS time to no greater than 15 seconds in order to improve functional mobility and decrease fall risk    Baseline 23 seconds    Time 8    Period Weeks    Status New    Target Date  02/19/21      PT LONG TERM GOAL #3   Title Pt will increase all LE MMT to 5/5 for all tested motions in order to improve mobility    Baseline see flowsheet    Time 8    Period Weeks    Status New    Target Date 02/19/21                   Plan - 01/28/21 1524     Clinical Impression Statement Patient's 5xSTS has much improved compared to baseling completing in 16 seconds today, nearing this long term functional goal. Continued with BLE strengthening without reports of pain. He has more difficulty performing isolated hip abduction on the RLE as he has tendency to compensate with hip flexors when performing.  He initially reports feeling bridges along quadriceps, but after completing hip flexor stretching he felt the exercise in his gluteal region. Added hip flexor stretching to HEP as he has significant tightness bilaterally.    Personal Factors and Comorbidities Comorbidity 3+    Comorbidities OSA, HTN, DM, Bipolar disorder, CAD,  s/p bil. TKA    Examination-Activity Limitations Squat;Stairs;Stand    Examination-Participation Restrictions Yard Work;Shop;Community Activity    Stability/Clinical Decision Making Stable/Uncomplicated    Rehab Potential Excellent    PT Frequency 2x / week    PT Duration 8 weeks    PT Treatment/Interventions ADLs/Self Care Home Management;Electrical Stimulation;Moist Heat;Cryotherapy;Gait training;Stair training;Functional mobility training;Therapeutic activities;Therapeutic exercise;Balance training;Neuromuscular re-education;Patient/family education;Manual techniques    PT Next Visit Plan LAQ on machine. progress standing strengthening, static balance    PT Home Exercise Plan Access Code: [email or web address]    Consulted and Agree with Plan of Care Patient             Patient will benefit from skilled therapeutic intervention in order to improve the following deficits and impairments:  Abnormal gait, Decreased activity tolerance, Decreased endurance,  Decreased range of motion, Decreased strength  Visit Diagnosis: Muscle weakness (generalized)  Other abnormalities of  gait and mobility     Problem List Patient Active Problem List   Diagnosis Date Noted   HNP (herniated nucleus pulposus), lumbar 07/10/2020   Right bundle branch block (RBBB) with left anterior hemiblock 06/22/2014   Hyperlipidemia    HTN (hypertension)    Erectile dysfunction    Depressed bipolar disorder (HCC)    OSA (obstructive sleep apnea)    CAD (coronary artery disease)    DJD (degenerative joint disease)    Letitia Libra, PT, DPT, ATC 01/28/21 3:46 PM   [facility] [address] [place], [place], [address] Phone: [phone]   Fax:  [phone]  Name: Charles Bonilla MRN: [id number] Date of Birth: 1933-11-02

## 2021-01-29 ENCOUNTER — Ambulatory Visit: Payer: Medicare HMO | Admitting: Podiatry

## 2021-01-29 ENCOUNTER — Encounter: Payer: Self-pay | Admitting: Podiatry

## 2021-01-29 DIAGNOSIS — E1149 Type 2 diabetes mellitus with other diabetic neurological complication: Secondary | ICD-10-CM | POA: Diagnosis not present

## 2021-01-29 DIAGNOSIS — E1169 Type 2 diabetes mellitus with other specified complication: Secondary | ICD-10-CM | POA: Insufficient documentation

## 2021-01-29 DIAGNOSIS — E291 Testicular hypofunction: Secondary | ICD-10-CM | POA: Insufficient documentation

## 2021-01-29 DIAGNOSIS — M79674 Pain in right toe(s): Secondary | ICD-10-CM | POA: Diagnosis not present

## 2021-01-29 DIAGNOSIS — M79675 Pain in left toe(s): Secondary | ICD-10-CM

## 2021-01-29 DIAGNOSIS — N182 Chronic kidney disease, stage 2 (mild): Secondary | ICD-10-CM | POA: Insufficient documentation

## 2021-01-29 DIAGNOSIS — B351 Tinea unguium: Secondary | ICD-10-CM | POA: Diagnosis not present

## 2021-01-29 DIAGNOSIS — N5201 Erectile dysfunction due to arterial insufficiency: Secondary | ICD-10-CM | POA: Insufficient documentation

## 2021-01-29 DIAGNOSIS — H9190 Unspecified hearing loss, unspecified ear: Secondary | ICD-10-CM | POA: Insufficient documentation

## 2021-01-29 DIAGNOSIS — E1121 Type 2 diabetes mellitus with diabetic nephropathy: Secondary | ICD-10-CM | POA: Insufficient documentation

## 2021-01-29 DIAGNOSIS — I5032 Chronic diastolic (congestive) heart failure: Secondary | ICD-10-CM | POA: Insufficient documentation

## 2021-01-29 DIAGNOSIS — Z794 Long term (current) use of insulin: Secondary | ICD-10-CM | POA: Insufficient documentation

## 2021-01-29 DIAGNOSIS — F325 Major depressive disorder, single episode, in full remission: Secondary | ICD-10-CM | POA: Insufficient documentation

## 2021-01-29 DIAGNOSIS — K5900 Constipation, unspecified: Secondary | ICD-10-CM | POA: Insufficient documentation

## 2021-01-29 DIAGNOSIS — E1165 Type 2 diabetes mellitus with hyperglycemia: Secondary | ICD-10-CM | POA: Insufficient documentation

## 2021-01-29 DIAGNOSIS — J309 Allergic rhinitis, unspecified: Secondary | ICD-10-CM | POA: Insufficient documentation

## 2021-01-29 DIAGNOSIS — E1142 Type 2 diabetes mellitus with diabetic polyneuropathy: Secondary | ICD-10-CM | POA: Insufficient documentation

## 2021-01-29 NOTE — Progress Notes (Signed)
Subjective: 87 y.o. returns the office today for painful, elongated, thickened toenails which he cannot trim himself, mostly the big toenails. He can trim the other nails but not the big ones and they cause pain. Denies any redness or drainage around the nails.  He has not noticed any ulcerations.  He states his blood sugar has been up a little today, around 130 but it is normally not that high..  Denies any systemic complaints such as fevers, chills, nausea, vomiting.   PCP: Kirby Funk, MD Last A1c- 7.5 07/10/2020  Objective: AAO 3, NAD DP/PT pulses palpable, CRT less than 3 seconds.  Nails hypertrophic, dystrophic, elongated, brittle, discolored 2. There is tenderness overlying the hallux nails bilaterally. There is no surrounding erythema or drainage along the nail sites. No open lesions or pre-ulcerative lesions are identified. No pain with calf compression, swelling, warmth, erythema.  Assessment: Patient presents with symptomatic onychomycosis  Plan: -Treatment options including alternatives, risks, complications were discussed -Nails sharply debrided 2 without complication/bleeding. -Discussed daily foot inspection. If there are any changes, to call the office immediately.  -Follow-up in 3 months or sooner if any problems are to arise. In the meantime, encouraged to call the office with any questions, concerns, changes symptoms.  Ovid Curd, DPM

## 2021-01-30 ENCOUNTER — Ambulatory Visit: Payer: Medicare HMO

## 2021-01-30 ENCOUNTER — Other Ambulatory Visit: Payer: Self-pay

## 2021-01-30 DIAGNOSIS — M6281 Muscle weakness (generalized): Secondary | ICD-10-CM

## 2021-01-30 DIAGNOSIS — R2689 Other abnormalities of gait and mobility: Secondary | ICD-10-CM

## 2021-01-30 NOTE — Therapy (Signed)
[facility] Outpatient Rehabilitation [facility] [address] [place], [place], [address] Phone: [phone]   Fax:  [phone]  Physical Therapy Treatment  Patient Details  Name: Charles Bonilla MRN: [id number] Date of Birth: 30-Mar-1934 Referring Provider (PT): Kirby Funk, MD   Encounter Date: 01/30/2021   PT End of Session - 01/30/21 1535     Visit Number 9    Number of Visits 17    Date for PT Re-Evaluation 02/19/21    Authorization Type [employer] MCR    Authorization Time Period 5/23-8/13    Authorization - Visit Number 8    Authorization - Number of Visits 16    Progress Note Due on Visit 10    PT Start Time 1530    PT Stop Time 1613    PT Time Calculation (min) 43 min    Activity Tolerance Patient tolerated treatment well    Behavior During Therapy [facility] for tasks assessed/performed             Past Medical History:  Diagnosis Date   CAD (coronary artery disease)     PTCA and stent March 2003   Constipation    Coronary atherosclerosis of unspecified type of vessel, native or graft    Depression    Diabetes mellitus without complication (HCC)    type 2   DJD (degenerative joint disease)    Dr. Luiz Blare   Erectile dysfunction    HTN (hypertension)    Hyperlipidemia    OSA (obstructive sleep apnea)    cpap- uses nightly   Other testicular hypofunction     Past Surgical History:  Procedure Laterality Date   CARDIAC CATHETERIZATION  2003   COLONOSCOPY     EYE SURGERY Bilateral    cataracts removed   L knee replacement, [name]     LUMBAR LAMINECTOMY/DECOMPRESSION MICRODISCECTOMY Left 07/10/2020   Procedure: Left Lumbar two-three microdiscectomy;  Surgeon: Coletta Memos, MD;  Location: [facility] OR;  Service: Neurosurgery;  Laterality: Left;   R knee replacement, [name]     WISDOM TOOTH EXTRACTION      There were no vitals filed for this visit.   Subjective Assessment - 01/30/21 1537     Subjective Patient reports he is doing good without  pain. Some soreness after last session, "but not too bad."    Currently in Pain? No/denies                               [facility] Adult PT Treatment/Exercise - 01/30/21 0001       Neuro Re-ed    Neuro Re-ed Details  romberg on foam eyes open 3 x 30 sec; sem-tandem 2 x 30 sec each; tandem attempted      Knee/Hip Exercises: Aerobic   Nustep 5 minutes level 6 LE only      Knee/Hip Exercises: Standing   Heel Raises 15 reps    Heel Raises Limitations x2    Other Standing Knee Exercises lateral step overs hurdle 2 x 10; forward step overs hurdle 1 x 10 each      Knee/Hip Exercises: Seated   Sit to Sand 10 reps   x2; 5# kettlebell                     PT Short Term Goals - 01/16/21 1543       PT SHORT TERM GOAL #1   Title Pt will be compliant and knolwedgeable with 90%  of initial HEP in order to improve carryover    Baseline no issues with initial HEP    Time 3    Period Weeks    Status Achieved    Target Date 01/15/21               PT Long Term Goals - 12/25/20 1814       PT LONG TERM GOAL #1   Title Pt will improve FOTO function score to no less than 66% in order to improve confidence and functional ability    Baseline 57%    Time 8    Period Weeks    Status New    Target Date 02/19/21      PT LONG TERM GOAL #2   Title Pt will decrease 5xSTS time to no greater than 15 seconds in order to improve functional mobility and decrease fall risk    Baseline 23 seconds    Time 8    Period Weeks    Status New    Target Date 02/19/21      PT LONG TERM GOAL #3   Title Pt will increase all LE MMT to 5/5 for all tested motions in order to improve mobility    Baseline see flowsheet    Time 8    Period Weeks    Status New    Target Date 02/19/21                   Plan - 01/30/21 1554     Clinical Impression Statement Patient tolerated session well today with progression of standing and balance activity without reports of pain.  He demonstrates good stability with lateral step overs, though occasional LOB with forward hurdle step overs with the LLE leading requiring use of UE to maintain balance. Minimal postural sway noted with romberg on uneven surface. Attempted tandem balance though patient unable to maintain. Improved postural stability with semi-tandem requiring occasional UE support when LLE in rear.    Personal Factors and Comorbidities Comorbidity 3+    Comorbidities OSA, HTN, DM, Bipolar disorder, CAD,  s/p bil. TKA    Examination-Activity Limitations Squat;Stairs;Stand    Examination-Participation Restrictions Yard Work;Shop;Community Activity    Stability/Clinical Decision Making Stable/Uncomplicated    Rehab Potential Excellent    PT Frequency 2x / week    PT Duration 8 weeks    PT Treatment/Interventions ADLs/Self Care Home Management;Electrical Stimulation;Moist Heat;Cryotherapy;Gait training;Stair training;Functional mobility training;Therapeutic activities;Therapeutic exercise;Balance training;Neuromuscular re-education;Patient/family education;Manual techniques    PT Next Visit Plan progress note; progress LE strength and balance as tolerated    PT Home Exercise Plan Access Code: [email or web address]    Consulted and Agree with Plan of Care Patient             Patient will benefit from skilled therapeutic intervention in order to improve the following deficits and impairments:  Abnormal gait, Decreased activity tolerance, Decreased endurance, Decreased range of motion, Decreased strength  Visit Diagnosis: Muscle weakness (generalized)  Other abnormalities of gait and mobility     Problem List Patient Active Problem List   Diagnosis Date Noted   Allergic rhinitis 01/29/2021   Chronic diastolic heart failure (HCC) 01/29/2021   Chronic kidney disease, stage 2 (mild) 01/29/2021   Constipation 01/29/2021   Diabetic peripheral neuropathy associated with type 2 diabetes mellitus (HCC) 01/29/2021    Diabetic renal disease (HCC) 01/29/2021   Erectile dysfunction due to arterial insufficiency 01/29/2021   Hearing loss 01/29/2021   Hyperglycemia due to type 2  diabetes mellitus (HCC) 01/29/2021   Long term (current) use of insulin (HCC) 01/29/2021   Major depression in complete remission (HCC) 01/29/2021   Male hypogonadism 01/29/2021   Morbid obesity (HCC) 01/29/2021   Type 2 diabetes mellitus with other specified complication (HCC) 01/29/2021   Body mass index (BMI) 45.0-49.9, adult (HCC) 07/30/2020   HNP (herniated nucleus pulposus), lumbar 07/10/2020   Right bundle branch block (RBBB) with left anterior hemiblock 06/22/2014   Hyperlipidemia    HTN (hypertension)    Erectile dysfunction    Depressed bipolar disorder (HCC)    OSA (obstructive sleep apnea)    CAD (coronary artery disease)    DJD (degenerative joint disease)    Letitia Libra, PT, DPT, ATC 01/30/21 4:19 PM   [facility] Out[facility] [address] [place], [place], [address] Phone: [phone]   Fax:  [phone]  Name: ROXIE GUEYE MRN: [id number] Date of Birth: 08/24/1933

## 2021-01-31 DIAGNOSIS — Z7189 Other specified counseling: Secondary | ICD-10-CM | POA: Diagnosis not present

## 2021-02-04 ENCOUNTER — Other Ambulatory Visit: Payer: Self-pay

## 2021-02-04 ENCOUNTER — Ambulatory Visit: Payer: Medicare HMO

## 2021-02-04 DIAGNOSIS — M6281 Muscle weakness (generalized): Secondary | ICD-10-CM | POA: Diagnosis not present

## 2021-02-04 DIAGNOSIS — R2689 Other abnormalities of gait and mobility: Secondary | ICD-10-CM

## 2021-02-04 NOTE — Therapy (Signed)
[place], [place], [address] Phone: [phone]   Fax:  [phone]  Physical Therapy Treatment  Patient Details  Name: Charles Bonilla MRN: [id number] Date of Birth: 02-27-1934 Referring Provider (PT): Lavone Orn, MD   Encounter Date: 02/04/2021   PT End of Session - 02/04/21 1524     Visit Number 10    Number of Visits 17    Date for PT Re-Evaluation 02/19/21    Authorization Type [employer] MCR    Authorization Time Period 5/23-8/13    Authorization - Visit Number 9    Authorization - Number of Visits 16    Progress Note Due on Visit 20    PT Start Time 1502    PT Stop Time 6761    PT Time Calculation (min) 41 min    Activity Tolerance Patient tolerated treatment well    Behavior During Therapy [facility] for tasks assessed/performed             Past Medical History:  Diagnosis Date   CAD (coronary artery disease)     PTCA and stent March 2003   Constipation    Coronary atherosclerosis of unspecified type of vessel, native or graft    Depression    Diabetes mellitus without complication ([place])    type 2   DJD (degenerative joint disease)    Dr. Berenice Primas   Erectile dysfunction    HTN (hypertension)    Hyperlipidemia    OSA (obstructive sleep apnea)    cpap- uses nightly   Other testicular hypofunction     Past Surgical History:  Procedure Laterality Date   CARDIAC CATHETERIZATION  2003   COLONOSCOPY     EYE SURGERY Bilateral    cataracts removed   L knee replacement, [name]     LUMBAR LAMINECTOMY/DECOMPRESSION MICRODISCECTOMY Left 07/10/2020   Procedure: Left Lumbar two-three microdiscectomy;  Surgeon: Ashok Pall, MD;  Location[facility];  Service: Neurosurgery;  Laterality: Left;   R knee replacement, [name]     WISDOM TOOTH EXTRACTION      There were no vitals filed for this visit.   Subjective Assessment - 02/04/21 1505     Subjective Patient reports he is doing good without  pain. He reports his HEP is exhausting, but is still completing. He denies any falls.    Currently in Pain? No/denies                [facility] PT Assessment - 02/04/21 0001       Observation/Other Assessments   Focus on Therapeutic Outcomes (FOTO)  96% function      Strength   Left Hip Flexion 5/5    Left Hip Extension 4+/5    Left Hip ABduction 4+/5    Left Hip ADduction 4/5      Transfers   Five time sit to stand comments  15 seconds                           [facility] Adult PT Treatment/Exercise - 02/04/21 0001       Self-Care   Other Self-Care Comments  see patient education      Knee/Hip Exercises: Aerobic   Nustep 5 minutes level 6 LE only      Knee/Hip Exercises: Standing   Heel Raises 10 reps    Heel Raises Limitations x2; on airex    Forward Step Up 10 reps    Forward Step Up Limitations  x2; 4 inch step; single UE support    Other Standing Knee Exercises march on airex 2 x 10      Knee/Hip Exercises: Sidelying   Hip ABduction Limitations 2 x 15; bilateral 4#                    PT Education - 02/04/21 1559     Education Details Education on overall functional progress; FOTO score.    Person(s) Educated Patient    Methods Explanation    Comprehension Verbalized understanding              PT Short Term Goals - 01/16/21 1543       PT SHORT TERM GOAL #1   Title Pt will be compliant and knolwedgeable with 90% of initial HEP in order to improve carryover    Baseline no issues with initial HEP    Time 3    Period Weeks    Status Achieved    Target Date 01/15/21               PT Long Term Goals - 02/04/21 1511       PT LONG TERM GOAL #1   Title Pt will improve FOTO function score to no less than 66% in order to improve confidence and functional ability    Baseline 96%    Time 8    Period Weeks    Status Achieved      PT LONG TERM GOAL #2   Title Pt will decrease 5xSTS time to no greater than 15 seconds in  order to improve functional mobility and decrease fall risk    Baseline 15 seconds    Time 8    Period Weeks    Status Achieved      PT LONG TERM GOAL #3   Title Pt will increase all LE MMT to 5/5 for all tested motions in order to improve mobility    Baseline see flowsheet    Time 8    Period Weeks    Status Partially Met                   Plan - 02/04/21 1519     Clinical Impression Statement Patient is making excellent progress towards his functional goals. He scored a 96% function on his FOTO outcome measure, which is significantly higher than the predicted score. His 5xSTS has much improved compared to baseline, though does continue to put him at an increased fall risk. He has met the majority of his functional goals with exception of the long term goal related to hip strength as he continues to have weakness in his left hip. He will benefit from continuing with current POC  to address lingering weakness and train in independent home program. He tolerated today's session well focusing on addressing lingering hip weakness with patient having difficulty performing step up on the LLE due to weakness requiring significant use of single UE support to perform. No reports of pain throughout session.    Personal Factors and Comorbidities Comorbidity 3+    Comorbidities OSA, HTN, DM, Bipolar disorder, CAD,  s/p bil. TKA    Examination-Activity Limitations Squat;Stairs;Stand    Examination-Participation Restrictions Yard Work;Shop;Community Activity    Stability/Clinical Decision Making Stable/Uncomplicated    Rehab Potential Excellent    PT Frequency 2x / week    PT Duration 8 weeks    PT Treatment/Interventions ADLs/Self Care Home Management;Electrical Stimulation;Moist Heat;Cryotherapy;Gait training;Stair training;Functional mobility training;Therapeutic activities;Therapeutic exercise;Balance training;Neuromuscular  re-education;Patient/family education;Manual techniques    PT Next  Visit Plan progress LE strength and balance as tolerated    PT Home Exercise Plan Access Code: [email or web address]    Consulted and Agree with Plan of Care Patient             Patient will benefit from skilled therapeutic intervention in order to improve the following deficits and impairments:  Abnormal gait, Decreased activity tolerance, Decreased endurance, Decreased range of motion, Decreased strength  Visit Diagnosis: Muscle weakness (generalized)  Other abnormalities of gait and mobility     Problem List Patient Active Problem List   Diagnosis Date Noted   Allergic rhinitis 01/29/2021   Chronic diastolic heart failure ([place]) 01/29/2021   Chronic kidney disease, stage 2 (mild) 01/29/2021   Constipation 01/29/2021   Diabetic peripheral neuropathy associated with type 2 diabetes mellitus ([place]) 01/29/2021   Diabetic renal disease ([place]) 01/29/2021   Erectile dysfunction due to arterial insufficiency 01/29/2021   Hearing loss 01/29/2021   Hyperglycemia due to type 2 diabetes mellitus ([place]) 01/29/2021   Long term (current) use of insulin ([place]) 01/29/2021   Major depression in complete remission ([place]) 01/29/2021   Male hypogonadism 01/29/2021   Morbid obesity ([place]) 01/29/2021   Type 2 diabetes mellitus with other specified complication ([place]) 48/40/3979   Body mass index (BMI) 45.0-49.9, adult ([employer]) 07/30/2020   HNP (herniated nucleus pulposus), lumbar 07/10/2020   Right bundle branch block (RBBB) with left anterior hemiblock 06/22/2014   Hyperlipidemia    HTN (hypertension)    Erectile dysfunction    Depressed bipolar disorder (HCC)    OSA (obstructive sleep apnea)    CAD (coronary artery disease)    DJD (degenerative joint disease)    Gwendolyn Grant, PT, DPT, ATC 02/04/21 4:05 PM   [facility] [address] [place], [place], [address] Phone: [phone]   Fax:  [phone]  Name: KACE HARTJE MRN: [id number] Date of  Birth: 04-Feb-1934

## 2021-02-06 ENCOUNTER — Ambulatory Visit: Payer: Medicare HMO

## 2021-02-06 ENCOUNTER — Other Ambulatory Visit: Payer: Self-pay

## 2021-02-06 DIAGNOSIS — M6281 Muscle weakness (generalized): Secondary | ICD-10-CM | POA: Diagnosis not present

## 2021-02-06 DIAGNOSIS — R2689 Other abnormalities of gait and mobility: Secondary | ICD-10-CM | POA: Diagnosis not present

## 2021-02-06 NOTE — Therapy (Signed)
[place], [place], [address] Phone: [phone]   Fax:  [phone]  Physical Therapy Treatment  Patient Details  Name: Charles Bonilla MRN: [id number] Date of Birth: 03/11/1934 Referring Provider (PT): Lavone Orn, MD   Encounter Date: 02/06/2021   PT End of Session - 02/06/21 1444     Visit Number 11    Number of Visits 17    Date for PT Re-Evaluation 02/19/21    Authorization Type [employer] MCR    Authorization Time Period 5/23-8/13    Authorization - Visit Number 10    Authorization - Number of Visits 16    Progress Note Due on Visit 66    PT Start Time 3007    PT Stop Time 1528    PT Time Calculation (min) 43 min    Activity Tolerance Patient tolerated treatment well    Behavior During Therapy [facility] for tasks assessed/performed             Past Medical History:  Diagnosis Date   CAD (coronary artery disease)     PTCA and stent March 2003   Constipation    Coronary atherosclerosis of unspecified type of vessel, native or graft    Depression    Diabetes mellitus without complication ([place])    type 2   DJD (degenerative joint disease)    Dr. Berenice Primas   Erectile dysfunction    HTN (hypertension)    Hyperlipidemia    OSA (obstructive sleep apnea)    cpap- uses nightly   Other testicular hypofunction     Past Surgical History:  Procedure Laterality Date   CARDIAC CATHETERIZATION  2003   COLONOSCOPY     EYE SURGERY Bilateral    cataracts removed   L knee replacement, [name]     LUMBAR LAMINECTOMY/DECOMPRESSION MICRODISCECTOMY Left 07/10/2020   Procedure: Left Lumbar two-three microdiscectomy;  Surgeon: Ashok Pall, MD;  Location: [place];  Service: Neurosurgery;  Laterality: Left;   R knee replacement, [name]     WISDOM TOOTH EXTRACTION      There were no vitals filed for this visit.   Subjective Assessment - 02/06/21 1451     Subjective Patient reports he is doing good without  pain. He was tired after last session that lasted about 30 minutes.    Currently in Pain? No/denies                               [facility] Adult PT Treatment/Exercise - 02/06/21 0001       Self-Care   Other Self-Care Comments  see patient education      Neuro Re-ed    Neuro Re-ed Details  romberg EC 3 x 30 sec; tandem 2 x 20 sec bilateral, attempted SLS unable      Knee/Hip Exercises: Aerobic   Nustep 5 minutes level 6      Knee/Hip Exercises: Standing   Hip Flexion 10 reps    Hip Flexion Limitations x2; green band at shins    Hip Abduction 10 reps    Abduction Limitations x2; green band at shins    Hip Extension 10 reps    Extension Limitations x2; green band at shins    Functional Squat 10 reps    Functional Squat Limitations x2; with UE support    Other Standing Knee Exercises heel/toe raises 2 x 10  PT Education - 02/06/21 1523     Education Details Education on updated HEP.    Person(s) Educated Patient    Methods Explanation;Demonstration;Verbal cues;Handout    Comprehension Verbalized understanding;Returned demonstration;Verbal cues required              PT Short Term Goals - 01/16/21 1543       PT SHORT TERM GOAL #1   Title Pt will be compliant and knolwedgeable with 90% of initial HEP in order to improve carryover    Baseline no issues with initial HEP    Time 3    Period Weeks    Status Achieved    Target Date 01/15/21               PT Long Term Goals - 02/04/21 1511       PT LONG TERM GOAL #1   Title Pt will improve FOTO function score to no less than 66% in order to improve confidence and functional ability    Baseline 96%    Time 8    Period Weeks    Status Achieved      PT LONG TERM GOAL #2   Title Pt will decrease 5xSTS time to no greater than 15 seconds in order to improve functional mobility and decrease fall risk    Baseline 15 seconds    Time 8    Period Weeks    Status Achieved       PT LONG TERM GOAL #3   Title Pt will increase all LE MMT to 5/5 for all tested motions in order to improve mobility    Baseline see flowsheet    Time 8    Period Weeks    Status Partially Met                   Plan - 02/06/21 1502     Clinical Impression Statement Patient tolerated session well today without reports of pain. Able to progress balance training with patient demonstrating minimal sway with static balance activity with eyes closed. He was able to maintain tandem stance bilaterally for >20 seconds, which he was previously unable to perform due to LOB. Attempted SLS, though patient unable to perform bilaterally. Able to progress standing hip strengthening with patient requiring intermittent cues to maintain neutral trunk alignment.    Personal Factors and Comorbidities Comorbidity 3+    Comorbidities OSA, HTN, DM, Bipolar disorder, CAD,  s/p bil. TKA    Examination-Activity Limitations Squat;Stairs;Stand    Examination-Participation Restrictions Yard Work;Shop;Community Activity    Stability/Clinical Decision Making Stable/Uncomplicated    Rehab Potential Excellent    PT Frequency 2x / week    PT Duration 8 weeks    PT Treatment/Interventions ADLs/Self Care Home Management;Electrical Stimulation;Moist Heat;Cryotherapy;Gait training;Stair training;Functional mobility training;Therapeutic activities;Therapeutic exercise;Balance training;Neuromuscular re-education;Patient/family education;Manual techniques    PT Next Visit Plan progress LE strength and balance as tolerated    PT Home Exercise Plan Access Code: [email or web address]    Consulted and Agree with Plan of Care Patient             Patient will benefit from skilled therapeutic intervention in order to improve the following deficits and impairments:  Abnormal gait, Decreased activity tolerance, Decreased endurance, Decreased range of motion, Decreased strength  Visit Diagnosis: Muscle weakness  (generalized)  Other abnormalities of gait and mobility     Problem List Patient Active Problem List   Diagnosis Date Noted   Allergic rhinitis 01/29/2021   Chronic diastolic heart  failure ([place]) 01/29/2021   Chronic kidney disease, stage 2 (mild) 01/29/2021   Constipation 01/29/2021   Diabetic peripheral neuropathy associated with type 2 diabetes mellitus ([place]) 01/29/2021   Diabetic renal disease ([place]) 01/29/2021   Erectile dysfunction due to arterial insufficiency 01/29/2021   Hearing loss 01/29/2021   Hyperglycemia due to type 2 diabetes mellitus ([place]) 01/29/2021   Long term (current) use of insulin ([place]) 01/29/2021   Major depression in complete remission ([place]) 01/29/2021   Male hypogonadism 01/29/2021   Morbid obesity ([place]) 01/29/2021   Type 2 diabetes mellitus with other specified complication ([place]) 14/43/9265   Body mass index (BMI) 45.0-49.9, adult ([place]) 07/30/2020   HNP (herniated nucleus pulposus), lumbar 07/10/2020   Right bundle branch block (RBBB) with left anterior hemiblock 06/22/2014   Hyperlipidemia    HTN (hypertension)    Erectile dysfunction    Depressed bipolar disorder (HCC)    OSA (obstructive sleep apnea)    CAD (coronary artery disease)    DJD (degenerative joint disease)    Gwendolyn Grant, PT, DPT, ATC 02/06/21 4:32 PM   [facility] [address] [place], [place], [address] Phone: [phone]   Fax:  [phone]  Name: Charles Bonilla MRN: [id number] Date of Birth: [birth year]

## 2021-02-13 ENCOUNTER — Ambulatory Visit: Payer: Medicare HMO | Attending: Internal Medicine

## 2021-02-13 ENCOUNTER — Other Ambulatory Visit: Payer: Self-pay

## 2021-02-13 DIAGNOSIS — I5032 Chronic diastolic (congestive) heart failure: Secondary | ICD-10-CM | POA: Diagnosis not present

## 2021-02-13 DIAGNOSIS — E1169 Type 2 diabetes mellitus with other specified complication: Secondary | ICD-10-CM | POA: Diagnosis not present

## 2021-02-13 DIAGNOSIS — I10 Essential (primary) hypertension: Secondary | ICD-10-CM | POA: Diagnosis not present

## 2021-02-13 DIAGNOSIS — F325 Major depressive disorder, single episode, in full remission: Secondary | ICD-10-CM | POA: Diagnosis not present

## 2021-02-13 DIAGNOSIS — R2689 Other abnormalities of gait and mobility: Secondary | ICD-10-CM | POA: Diagnosis not present

## 2021-02-13 DIAGNOSIS — N182 Chronic kidney disease, stage 2 (mild): Secondary | ICD-10-CM | POA: Diagnosis not present

## 2021-02-13 DIAGNOSIS — E1165 Type 2 diabetes mellitus with hyperglycemia: Secondary | ICD-10-CM | POA: Diagnosis not present

## 2021-02-13 DIAGNOSIS — M6281 Muscle weakness (generalized): Secondary | ICD-10-CM | POA: Diagnosis not present

## 2021-02-13 DIAGNOSIS — I2510 Atherosclerotic heart disease of native coronary artery without angina pectoris: Secondary | ICD-10-CM | POA: Diagnosis not present

## 2021-02-13 DIAGNOSIS — E1121 Type 2 diabetes mellitus with diabetic nephropathy: Secondary | ICD-10-CM | POA: Diagnosis not present

## 2021-02-13 DIAGNOSIS — E7800 Pure hypercholesterolemia, unspecified: Secondary | ICD-10-CM | POA: Diagnosis not present

## 2021-02-13 NOTE — Therapy (Signed)
[place], [place], [address] Phone: [phone]   Fax:  [phone]  Physical Therapy Treatment  Patient Details  Name: Charles Bonilla MRN: [id number] Date of Birth: [birth year] Referring Provider (PT): Lavone Orn, MD   Encounter Date: 02/13/2021   PT End of Session - 02/13/21 1617     Visit Number 12    Number of Visits 17    Date for PT Re-Evaluation 02/19/21    Authorization Type [employer] MCR    Authorization Time Period 5/23-8/13    Authorization - Visit Number 11    Authorization - Number of Visits 16    Progress Note Due on Visit 80    PT Start Time 7371    PT Stop Time 0626    PT Time Calculation (min) 38 min    Activity Tolerance Patient tolerated treatment well    Behavior During Therapy [facility] for tasks assessed/performed             Past Medical History:  Diagnosis Date   CAD (coronary artery disease)     PTCA and stent March 2003   Constipation    Coronary atherosclerosis of unspecified type of vessel, native or graft    Depression    Diabetes mellitus without complication ([place])    type 2   DJD (degenerative joint disease)    Dr. Berenice Primas   Erectile dysfunction    HTN (hypertension)    Hyperlipidemia    OSA (obstructive sleep apnea)    cpap- uses nightly   Other testicular hypofunction     Past Surgical History:  Procedure Laterality Date   CARDIAC CATHETERIZATION  2003   COLONOSCOPY     EYE SURGERY Bilateral    cataracts removed   L knee replacement, [name]     LUMBAR LAMINECTOMY/DECOMPRESSION MICRODISCECTOMY Left 07/10/2020   Procedure: Left Lumbar two-three microdiscectomy;  Surgeon: Ashok Pall, MD;  Location: [place];  Service: Neurosurgery;  Laterality: Left;   R knee replacement, [name]     WISDOM TOOTH EXTRACTION      There were no vitals filed for this visit.   Subjective Assessment - 02/13/21 1618     Subjective Patient reports he is feeling tired, though  no pain.    Currently in Pain? No/denies                               [facility] Adult PT Treatment/Exercise - 02/13/21 0001       Knee/Hip Exercises: Aerobic   Nustep 5 minutes level 6      Knee/Hip Exercises: Machines for Strengthening   Hip Cybex extension 37.5 lbs LLE x 2; flexion 37.5 lbs LLE x 2      Knee/Hip Exercises: Standing   Hip Abduction 10 reps    Abduction Limitations blue band    Hip Extension 10 reps    Extension Limitations x2; blue band; bilateral    Forward Step Up 10 reps    Forward Step Up Limitations x2; 4 inch LLE leading (attempted on 6 inch unable due to weakness)                      PT Short Term Goals - 01/16/21 1543       PT SHORT TERM GOAL #1   Title Pt will be compliant and knolwedgeable with 90% of initial HEP in order to improve carryover    Baseline  no issues with initial HEP    Time 3    Period Weeks    Status Achieved    Target Date 01/15/21               PT Long Term Goals - 02/04/21 1511       PT LONG TERM GOAL #1   Title Pt will improve FOTO function score to no less than 66% in order to improve confidence and functional ability    Baseline 96%    Time 8    Period Weeks    Status Achieved      PT LONG TERM GOAL #2   Title Pt will decrease 5xSTS time to no greater than 15 seconds in order to improve functional mobility and decrease fall risk    Baseline 15 seconds    Time 8    Period Weeks    Status Achieved      PT LONG TERM GOAL #3   Title Pt will increase all LE MMT to 5/5 for all tested motions in order to improve mobility    Baseline see flowsheet    Time 8    Period Weeks    Status Partially Met                   Plan - 02/13/21 1633     Clinical Impression Statement Patient tolerated session well today with continued progression of LE strengthening. Attempted forward step up on 6 inch step with LLE, though due to weakness and LOB patient unable to perform even with  significant UE support. He was able to perform LLE step up on 4 inch step with minimal UE support and ability to maintain postural control. No reports of pain throughout session.    Personal Factors and Comorbidities Comorbidity 3+    Comorbidities OSA, HTN, DM, Bipolar disorder, CAD,  s/p bil. TKA    Examination-Activity Limitations Squat;Stairs;Stand    Examination-Participation Restrictions Yard Work;Shop;Community Activity    Stability/Clinical Decision Making Stable/Uncomplicated    Rehab Potential Excellent    PT Frequency 2x / week    PT Duration 8 weeks    PT Treatment/Interventions ADLs/Self Care Home Management;Electrical Stimulation;Moist Heat;Cryotherapy;Gait training;Stair training;Functional mobility training;Therapeutic activities;Therapeutic exercise;Balance training;Neuromuscular re-education;Patient/family education;Manual techniques    PT Next Visit Plan progress LE strength and balance as tolerated    PT Home Exercise Plan Access Code: [email or web address]    Consulted and Agree with Plan of Care Patient             Patient will benefit from skilled therapeutic intervention in order to improve the following deficits and impairments:  Abnormal gait, Decreased activity tolerance, Decreased endurance, Decreased range of motion, Decreased strength  Visit Diagnosis: Muscle weakness (generalized)  Other abnormalities of gait and mobility     Problem List Patient Active Problem List   Diagnosis Date Noted   Allergic rhinitis 01/29/2021   Chronic diastolic heart failure ([place]) 01/29/2021   Chronic kidney disease, stage 2 (mild) 01/29/2021   Constipation 01/29/2021   Diabetic peripheral neuropathy associated with type 2 diabetes mellitus ([place]) 01/29/2021   Diabetic renal disease ([place]) 01/29/2021   Erectile dysfunction due to arterial insufficiency 01/29/2021   Hearing loss 01/29/2021   Hyperglycemia due to type 2 diabetes mellitus ([place]) 01/29/2021   Long term (current) use of  insulin ([place]) 01/29/2021   Major depression in complete remission ([place]) 01/29/2021   Male hypogonadism 01/29/2021   Morbid obesity ([place]) 01/29/2021   Type 2 diabetes mellitus  with other specified complication ([place]) 52/84/1324   Body mass index (BMI) 45.0-49.9, adult ([place]) 07/30/2020   HNP (herniated nucleus pulposus), lumbar 07/10/2020   Right bundle branch block (RBBB) with left anterior hemiblock 06/22/2014   Hyperlipidemia    HTN (hypertension)    Erectile dysfunction    Depressed bipolar disorder (HCC)    OSA (obstructive sleep apnea)    CAD (coronary artery disease)    DJD (degenerative joint disease)    Gwendolyn Grant, PT, DPT, ATC 02/13/21 5:27 PM  [place] [facility] [address] [place], [place], [address] Phone: [phone]   Fax:  [phone]  Name: HARLEM BULA MRN: [id number] Date of Birth: [birth year]

## 2021-02-14 ENCOUNTER — Ambulatory Visit: Payer: Medicare HMO

## 2021-02-14 DIAGNOSIS — R2689 Other abnormalities of gait and mobility: Secondary | ICD-10-CM | POA: Diagnosis not present

## 2021-02-14 DIAGNOSIS — M6281 Muscle weakness (generalized): Secondary | ICD-10-CM | POA: Diagnosis not present

## 2021-02-14 NOTE — Therapy (Signed)
[place], [place], [address] Phone: [phone]   Fax:  [phone]  Physical Therapy Treatment  Patient Details  Name: Charles Bonilla MRN: [id number] Date of Birth: 1934-06-27 Referring Provider (PT): Lavone Orn, MD   Encounter Date: 02/14/2021   PT End of Session - 02/14/21 1152     Visit Number 13    Number of Visits 17    Date for PT Re-Evaluation 02/19/21    Authorization Type [employer] MCR    Authorization Time Period 5/23-8/13    Authorization - Visit Number 12    Authorization - Number of Visits 16    Progress Note Due on Visit 20    PT Start Time 1152    PT Stop Time 3875    PT Time Calculation (min) 38 min    Activity Tolerance Patient tolerated treatment well    Behavior During Therapy [facility] for tasks assessed/performed             Past Medical History:  Diagnosis Date   CAD (coronary artery disease)     PTCA and stent March 2003   Constipation    Coronary atherosclerosis of unspecified type of vessel, native or graft    Depression    Diabetes mellitus without complication ([place])    type 2   DJD (degenerative joint disease)    Dr. Berenice Primas   Erectile dysfunction    HTN (hypertension)    Hyperlipidemia    OSA (obstructive sleep apnea)    cpap- uses nightly   Other testicular hypofunction     Past Surgical History:  Procedure Laterality Date   CARDIAC CATHETERIZATION  2003   COLONOSCOPY     EYE SURGERY Bilateral    cataracts removed   L knee replacement, [name]     LUMBAR LAMINECTOMY/DECOMPRESSION MICRODISCECTOMY Left 07/10/2020   Procedure: Left Lumbar two-three microdiscectomy;  Surgeon: Ashok Pall, MD;  Location: [place];  Service: Neurosurgery;  Laterality: Left;   R knee replacement, [name]     WISDOM TOOTH EXTRACTION      There were no vitals filed for this visit.   Subjective Assessment - 02/14/21 1156     Subjective Patient reports he is sore in both of his  legs from yesterday's session.    Currently in Pain? No/denies                               [facility] Adult PT Treatment/Exercise - 02/14/21 0001       Neuro Re-ed    Neuro Re-ed Details  tandem 3 trials each 15-20 sec each trial, romberg EX 1 x 30sec, romberg EC on foam 3 x 5 sec; romberg on foam 30 sec, semi-tandem on foam 1 trial each bilaterally      Knee/Hip Exercises: Stretches   Passive Hamstring Stretch 60 seconds    Passive Hamstring Stretch Limitations bilateral    Hip Flexor Stretch 60 seconds    Hip Flexor Stretch Limitations bilateral    Other Knee/Hip Stretches figure 4 stretch 30 sec each    Other Knee/Hip Stretches LTR 60 seconds      Knee/Hip Exercises: Aerobic   Nustep 5 minutes level 6                      PT Short Term Goals - 01/16/21 1543       PT SHORT TERM GOAL #1   Title  Pt will be compliant and knolwedgeable with 90% of initial HEP in order to improve carryover    Baseline no issues with initial HEP    Time 3    Period Weeks    Status Achieved    Target Date 01/15/21               PT Long Term Goals - 02/04/21 1511       PT LONG TERM GOAL #1   Title Pt will improve FOTO function score to no less than 66% in order to improve confidence and functional ability    Baseline 96%    Time 8    Period Weeks    Status Achieved      PT LONG TERM GOAL #2   Title Pt will decrease 5xSTS time to no greater than 15 seconds in order to improve functional mobility and decrease fall risk    Baseline 15 seconds    Time 8    Period Weeks    Status Achieved      PT LONG TERM GOAL #3   Title Pt will increase all LE MMT to 5/5 for all tested motions in order to improve mobility    Baseline see flowsheet    Time 8    Period Weeks    Status Partially Met                   Plan - 02/14/21 1200     Clinical Impression Statement Patient arrives with reports of BLE soreness likely from yesterday's PT session that  heavily focused on strengthening. Today's session focused on LE stretching and balance training with patient tolerating session well requiring occasional seated rest break due to fatigue. His static balance is improving with patient demonstrating minimal postural sway with tandem stance and balance training on uneven surface.    Personal Factors and Comorbidities Comorbidity 3+    Comorbidities OSA, HTN, DM, Bipolar disorder, CAD,  s/p bil. TKA    Examination-Activity Limitations Squat;Stairs;Stand    Examination-Participation Restrictions Yard Work;Shop;Community Activity    Stability/Clinical Decision Making Stable/Uncomplicated    Rehab Potential Excellent    PT Frequency 2x / week    PT Duration 8 weeks    PT Treatment/Interventions ADLs/Self Care Home Management;Electrical Stimulation;Moist Heat;Cryotherapy;Gait training;Stair training;Functional mobility training;Therapeutic activities;Therapeutic exercise;Balance training;Neuromuscular re-education;Patient/family education;Manual techniques    PT Next Visit Plan re-eval; anticipate D/C    PT Home Exercise Plan Access Code: [email or web address]    Consulted and Agree with Plan of Care Patient             Patient will benefit from skilled therapeutic intervention in order to improve the following deficits and impairments:  Abnormal gait, Decreased activity tolerance, Decreased endurance, Decreased range of motion, Decreased strength  Visit Diagnosis: Muscle weakness (generalized)  Other abnormalities of gait and mobility     Problem List Patient Active Problem List   Diagnosis Date Noted   Allergic rhinitis 01/29/2021   Chronic diastolic heart failure ([place]) 01/29/2021   Chronic kidney disease, stage 2 (mild) 01/29/2021   Constipation 01/29/2021   Diabetic peripheral neuropathy associated with type 2 diabetes mellitus ([place]) 01/29/2021   Diabetic renal disease ([place]) 01/29/2021   Erectile dysfunction due to arterial insufficiency  01/29/2021   Hearing loss 01/29/2021   Hyperglycemia due to type 2 diabetes mellitus ([place]) 01/29/2021   Long term (current) use of insulin ([place]) 01/29/2021   Major depression in complete remission ([place]) 01/29/2021   Male hypogonadism 01/29/2021  Morbid obesity ([place]) 01/29/2021   Type 2 diabetes mellitus with other specified complication ([place]) 14/23/9532   Body mass index (BMI) 45.0-49.9, adult ([place]) 07/30/2020   HNP (herniated nucleus pulposus), lumbar 07/10/2020   Right bundle branch block (RBBB) with left anterior hemiblock 06/22/2014   Hyperlipidemia    HTN (hypertension)    Erectile dysfunction    Depressed bipolar disorder (HCC)    OSA (obstructive sleep apnea)    CAD (coronary artery disease)    DJD (degenerative joint disease)    Gwendolyn Grant, PT, DPT, ATC 02/14/21 1:23 PM   [facility] [address] [place], [place], [address] Phone: [phone]   Fax:  [phone]  Name: Charles Bonilla MRN: [id number] Date of Birth: 1933-09-13

## 2021-02-18 ENCOUNTER — Ambulatory Visit: Payer: Medicare HMO

## 2021-02-18 ENCOUNTER — Other Ambulatory Visit: Payer: Self-pay

## 2021-02-18 DIAGNOSIS — R2689 Other abnormalities of gait and mobility: Secondary | ICD-10-CM | POA: Diagnosis not present

## 2021-02-18 DIAGNOSIS — M6281 Muscle weakness (generalized): Secondary | ICD-10-CM | POA: Diagnosis not present

## 2021-02-18 NOTE — Therapy (Signed)
[place], [place], [address] Phone: [phone]   Fax:  [phone]  Physical Therapy Treatment/Discharge  Patient Details  Name: Charles Bonilla MRN: [id number] Date of Birth: 26-May-1934 Referring Provider (PT): Lavone Orn, MD   Encounter Date: 02/18/2021   PT End of Session - 02/18/21 1549     Visit Number 14    Number of Visits 17    Date for PT Re-Evaluation 02/19/21    Authorization Type [employer] MCR    Authorization Time Period 5/23-8/13    Authorization - Visit Number 74    Authorization - Number of Visits 16    Progress Note Due on Visit 33    PT Start Time 0712    PT Stop Time 1625    PT Time Calculation (min) 39 min    Activity Tolerance Patient tolerated treatment well    Behavior During Therapy [facility] for tasks assessed/performed             Past Medical History:  Diagnosis Date   CAD (coronary artery disease)     PTCA and stent March 2003   Constipation    Coronary atherosclerosis of unspecified type of vessel, native or graft    Depression    Diabetes mellitus without complication ([place])    type 2   DJD (degenerative joint disease)    Dr. Berenice Primas   Erectile dysfunction    HTN (hypertension)    Hyperlipidemia    OSA (obstructive sleep apnea)    cpap- uses nightly   Other testicular hypofunction     Past Surgical History:  Procedure Laterality Date   CARDIAC CATHETERIZATION  2003   COLONOSCOPY     EYE SURGERY Bilateral    cataracts removed   L knee replacement, [name]     LUMBAR LAMINECTOMY/DECOMPRESSION MICRODISCECTOMY Left 07/10/2020   Procedure: Left Lumbar two-three microdiscectomy;  Surgeon: Ashok Pall, MD;  Location: [facility];  Service: Neurosurgery;  Laterality: Left;   R knee replacement, [name]     WISDOM TOOTH EXTRACTION      There were no vitals filed for this visit.   Subjective Assessment - 02/18/21 1631     Subjective Patient reports he is doing well.  No issues to report.    Currently in Pain? No/denies                [facility] PT Assessment - 02/18/21 0001       Observation/Other Assessments   Focus on Therapeutic Outcomes (FOTO)  75% function      Strength   Right Hip Flexion 5/5    Right Hip ABduction 5/5    Right Hip ADduction 5/5    Left Hip Flexion 5/5    Left Hip ABduction 5/5    Left Hip ADduction 5/5    Right Knee Flexion 5/5    Right Knee Extension 5/5    Left Knee Flexion 5/5    Left Knee Extension 5/5    Right Ankle Dorsiflexion 5/5    Right Ankle Plantar Flexion 5/5    Left Ankle Dorsiflexion 5/5    Left Ankle Plantar Flexion 5/5                           [facility] Adult PT Treatment/Exercise - 02/18/21 0001       Self-Care   Other Self-Care Comments  see patient education      Knee/Hip Exercises: Aerobic   Nustep 5  minutes level 6      Knee/Hip Exercises: Standing   Functional Squat 10 reps    Functional Squat Limitations x2; with UE support      Knee/Hip Exercises: Seated   Sit to Sand 5 reps      Knee/Hip Exercises: Supine   Bridges 10 reps    Bridges Limitations x2                    PT Education - 02/18/21 1613     Education Details Education on overall progress. D/C education. Updated/reviewed HEP. FOTO score.    Person(s) Educated Patient    Methods Explanation;Verbal cues;Handout    Comprehension Verbalized understanding;Returned demonstration              PT Short Term Goals - 01/16/21 1543       PT SHORT TERM GOAL #1   Title Pt will be compliant and knolwedgeable with 90% of initial HEP in order to improve carryover    Baseline no issues with initial HEP    Time 3    Period Weeks    Status Achieved    Target Date 01/15/21               PT Long Term Goals - 02/18/21 1602       PT LONG TERM GOAL #1   Title Pt will improve FOTO function score to no less than 66% in order to improve confidence and functional ability    Baseline 75%  function    Time 8    Period Weeks    Status Achieved      PT LONG TERM GOAL #2   Title Pt will decrease 5xSTS time to no greater than 15 seconds in order to improve functional mobility and decrease fall risk    Baseline 15 seconds    Time 8    Period Weeks    Status Achieved      PT LONG TERM GOAL #3   Title Pt will increase all LE MMT to 5/5 for all tested motions in order to improve mobility    Baseline see flowsheet    Time 8    Period Weeks    Status Achieved                   Plan - 02/18/21 1607     Clinical Impression Statement Patient has made excellent functional progress since start of care having met all established functional goals. His FOTO outcome score has much surpassed the predicted outcome and he demonstrates improvements in 5xSTS test, static balance, and LLE strengthening since his start of care. He demonstrates independence with advanced home program issued today and is appropriate for discharge at this time.    Personal Factors and Comorbidities Comorbidity 3+    Comorbidities OSA, HTN, DM, Bipolar disorder, CAD,  s/p bil. TKA    Examination-Activity Limitations Stairs;Stand    Examination-Participation Restrictions Yard Work;Shop;Community Activity    Stability/Clinical Decision Making --    Rehab Potential --    PT Frequency --    PT Duration --    PT Treatment/Interventions ADLs/Self Care Home Management;Electrical Stimulation;Moist Heat;Cryotherapy;Gait training;Stair training;Functional mobility training;Therapeutic activities;Therapeutic exercise;Balance training;Neuromuscular re-education;Patient/family education;Manual techniques    PT Next Visit Plan --    PT Home Exercise Plan Access Code: [email or web address]    Consulted and Agree with Plan of Care Patient             Patient will benefit from skilled  therapeutic intervention in order to improve the following deficits and impairments:  Abnormal gait, Decreased activity tolerance, Decreased  endurance, Decreased range of motion, Decreased strength  Visit Diagnosis: Muscle weakness (generalized)  Other abnormalities of gait and mobility     Problem List Patient Active Problem List   Diagnosis Date Noted   Allergic rhinitis 01/29/2021   Chronic diastolic heart failure ([place]) 01/29/2021   Chronic kidney disease, stage 2 (mild) 01/29/2021   Constipation 01/29/2021   Diabetic peripheral neuropathy associated with type 2 diabetes mellitus ([place]) 01/29/2021   Diabetic renal disease ([place]) 01/29/2021   Erectile dysfunction due to arterial insufficiency 01/29/2021   Hearing loss 01/29/2021   Hyperglycemia due to type 2 diabetes mellitus ([place]) 01/29/2021   Long term (current) use of insulin ([place]) 01/29/2021   Major depression in complete remission ([place]) 01/29/2021   Male hypogonadism 01/29/2021   Morbid obesity ([place]) 01/29/2021   Type 2 diabetes mellitus with other specified complication ([place]) 27/74/1287   Body mass index (BMI) 45.0-49.9, adult ([place]) 07/30/2020   HNP (herniated nucleus pulposus), lumbar 07/10/2020   Right bundle branch block (RBBB) with left anterior hemiblock 06/22/2014   Hyperlipidemia    HTN (hypertension)    Erectile dysfunction    Depressed bipolar disorder ([facility])    OSA (obstructive sleep apnea)    CAD (coronary artery disease)    DJD (degenerative joint disease)   PHYSICAL THERAPY DISCHARGE SUMMARY  Visits from Start of Care: 14  Current functional level related to goals / functional outcomes: Goals met   Remaining deficits: N/A   Education / Equipment: See above   Patient agrees to discharge. Patient goals were met. Patient is being discharged due to meeting the stated rehab goals.  Gwendolyn Grant, PT, DPT, ATC 02/18/21 4:34 PM   [place] [facility] [address] [place], [place], [address] Phone: [phone]   Fax:  [phone]  Name: YEISON SIPPEL MRN: [id number] Date of Birth:  [birth year]

## 2021-04-25 DIAGNOSIS — I10 Essential (primary) hypertension: Secondary | ICD-10-CM | POA: Diagnosis not present

## 2021-04-25 DIAGNOSIS — I2510 Atherosclerotic heart disease of native coronary artery without angina pectoris: Secondary | ICD-10-CM | POA: Diagnosis not present

## 2021-04-25 DIAGNOSIS — Z7984 Long term (current) use of oral hypoglycemic drugs: Secondary | ICD-10-CM | POA: Diagnosis not present

## 2021-04-25 DIAGNOSIS — Z23 Encounter for immunization: Secondary | ICD-10-CM | POA: Diagnosis not present

## 2021-04-25 DIAGNOSIS — Z794 Long term (current) use of insulin: Secondary | ICD-10-CM | POA: Diagnosis not present

## 2021-04-25 DIAGNOSIS — E538 Deficiency of other specified B group vitamins: Secondary | ICD-10-CM | POA: Diagnosis not present

## 2021-04-25 DIAGNOSIS — E1121 Type 2 diabetes mellitus with diabetic nephropathy: Secondary | ICD-10-CM | POA: Diagnosis not present

## 2021-04-25 DIAGNOSIS — N182 Chronic kidney disease, stage 2 (mild): Secondary | ICD-10-CM | POA: Diagnosis not present

## 2021-04-25 DIAGNOSIS — I5032 Chronic diastolic (congestive) heart failure: Secondary | ICD-10-CM | POA: Diagnosis not present

## 2021-05-02 ENCOUNTER — Ambulatory Visit: Payer: Medicare HMO | Admitting: Podiatry

## 2021-05-02 ENCOUNTER — Other Ambulatory Visit: Payer: Self-pay

## 2021-05-02 DIAGNOSIS — B351 Tinea unguium: Secondary | ICD-10-CM

## 2021-05-02 DIAGNOSIS — E1149 Type 2 diabetes mellitus with other diabetic neurological complication: Secondary | ICD-10-CM | POA: Diagnosis not present

## 2021-05-02 DIAGNOSIS — M79675 Pain in left toe(s): Secondary | ICD-10-CM

## 2021-05-02 DIAGNOSIS — M79674 Pain in right toe(s): Secondary | ICD-10-CM

## 2021-05-03 NOTE — Progress Notes (Signed)
Subjective: 87 y.o. returns the office today for painful, elongated, thickened toenails which he cannot trim himself, mostly the big toenails. He does get ingrown toenails at times to the big toenails.  No redness, drainage, or any swelling. Denies any systemic complaints such as fevers, chills, nausea, vomiting.   PCP: Kirby Funk, MD Last A1c- 7.5 07/10/2020  Objective: AAO 3, NAD DP/PT pulses palpable, CRT less than 3 seconds.  Nails hypertrophic, dystrophic, elongated, brittle, discolored 2. There is tenderness overlying the hallux nails bilaterally. There is no surrounding erythema or drainage along the nail sites. No open lesions or pre-ulcerative lesions are identified. No pain with calf compression, swelling, warmth, erythema.  Assessment: Patient presents with symptomatic onychomycosis  Plan: -Treatment options including alternatives, risks, complications were discussed -Nails sharply debrided 2 without complication/bleeding. As a courtesy I sharply debrided the other nails without complications or bleeding. He does not want to proceed with partial nail avulsion.  -Discussed daily foot inspection. If there are any changes, to call the office immediately.  -Follow-up in 3 months or sooner if any problems are to arise. In the meantime, encouraged to call the office with any questions, concerns, changes symptoms.  Ovid Curd, DPM

## 2021-05-08 DIAGNOSIS — E7800 Pure hypercholesterolemia, unspecified: Secondary | ICD-10-CM | POA: Diagnosis not present

## 2021-05-08 DIAGNOSIS — F325 Major depressive disorder, single episode, in full remission: Secondary | ICD-10-CM | POA: Diagnosis not present

## 2021-05-08 DIAGNOSIS — E1165 Type 2 diabetes mellitus with hyperglycemia: Secondary | ICD-10-CM | POA: Diagnosis not present

## 2021-05-08 DIAGNOSIS — I10 Essential (primary) hypertension: Secondary | ICD-10-CM | POA: Diagnosis not present

## 2021-05-08 DIAGNOSIS — I5032 Chronic diastolic (congestive) heart failure: Secondary | ICD-10-CM | POA: Diagnosis not present

## 2021-05-08 DIAGNOSIS — I2510 Atherosclerotic heart disease of native coronary artery without angina pectoris: Secondary | ICD-10-CM | POA: Diagnosis not present

## 2021-05-08 DIAGNOSIS — E1169 Type 2 diabetes mellitus with other specified complication: Secondary | ICD-10-CM | POA: Diagnosis not present

## 2021-05-08 DIAGNOSIS — E1121 Type 2 diabetes mellitus with diabetic nephropathy: Secondary | ICD-10-CM | POA: Diagnosis not present

## 2021-05-08 DIAGNOSIS — N182 Chronic kidney disease, stage 2 (mild): Secondary | ICD-10-CM | POA: Diagnosis not present

## 2021-06-17 DIAGNOSIS — F325 Major depressive disorder, single episode, in full remission: Secondary | ICD-10-CM | POA: Diagnosis not present

## 2021-06-27 DIAGNOSIS — N182 Chronic kidney disease, stage 2 (mild): Secondary | ICD-10-CM | POA: Diagnosis not present

## 2021-06-27 DIAGNOSIS — F325 Major depressive disorder, single episode, in full remission: Secondary | ICD-10-CM | POA: Diagnosis not present

## 2021-06-27 DIAGNOSIS — I10 Essential (primary) hypertension: Secondary | ICD-10-CM | POA: Diagnosis not present

## 2021-06-27 DIAGNOSIS — M179 Osteoarthritis of knee, unspecified: Secondary | ICD-10-CM | POA: Diagnosis not present

## 2021-06-27 DIAGNOSIS — E1169 Type 2 diabetes mellitus with other specified complication: Secondary | ICD-10-CM | POA: Diagnosis not present

## 2021-06-27 DIAGNOSIS — E1121 Type 2 diabetes mellitus with diabetic nephropathy: Secondary | ICD-10-CM | POA: Diagnosis not present

## 2021-06-27 DIAGNOSIS — E7800 Pure hypercholesterolemia, unspecified: Secondary | ICD-10-CM | POA: Diagnosis not present

## 2021-06-27 DIAGNOSIS — E1140 Type 2 diabetes mellitus with diabetic neuropathy, unspecified: Secondary | ICD-10-CM | POA: Diagnosis not present

## 2021-08-01 ENCOUNTER — Ambulatory Visit (INDEPENDENT_AMBULATORY_CARE_PROVIDER_SITE_OTHER): Payer: Medicare HMO | Admitting: Podiatry

## 2021-08-01 ENCOUNTER — Other Ambulatory Visit: Payer: Self-pay

## 2021-08-01 DIAGNOSIS — M79674 Pain in right toe(s): Secondary | ICD-10-CM | POA: Diagnosis not present

## 2021-08-01 DIAGNOSIS — L600 Ingrowing nail: Secondary | ICD-10-CM

## 2021-08-01 DIAGNOSIS — B351 Tinea unguium: Secondary | ICD-10-CM

## 2021-08-01 DIAGNOSIS — E1149 Type 2 diabetes mellitus with other diabetic neurological complication: Secondary | ICD-10-CM | POA: Diagnosis not present

## 2021-08-01 DIAGNOSIS — M79675 Pain in left toe(s): Secondary | ICD-10-CM | POA: Diagnosis not present

## 2021-08-01 MED ORDER — CEPHALEXIN 500 MG PO CAPS: 500.0000 mg | ORAL_CAPSULE | Freq: Three times a day (TID) | ORAL | 0 refills | Status: DC

## 2021-08-01 NOTE — Patient Instructions (Signed)
Wash the right big toe with soap and water and dry well. Apply a small amount of antibiotic ointment and bandage. Change daily. If you notice any increase in redness, drainage, swelling or other signs of infection, let me know immediately or go to the ER.

## 2021-08-06 NOTE — Progress Notes (Signed)
Subjective: 87 y.o. returns the office today for painful, elongated, thickened toenails which he cannot trim himself, mostly the big toenails.  He has no new concerns today.  Not seeing any open sores or any drainage or swelling.  Denies any systemic complaints such as fevers, chills, nausea, vomiting.   PCP: Kirby Funk, MD Last A1c- 7.5 07/10/2020  Objective: AAO 3, NAD DP/PT pulses palpable, CRT less than 3 seconds.  Upon initial evaluation of his right foot there is drainage coming from his right big toenail, medial aspect and is clear to bloody drainage.  There is no purulence.  Localized edema.  The nail is loose with underlying nailbed and upon debridement there is a granular wound present.  There is no fluctuation crepitation.  No signs of abscess otherwise.  The remainder of the toenails appear to be hypertrophic, dystrophic with yellow-brown discoloration most on his left big toenail which causes discomfort.  Other toenails are not causing any pain. No pain with calf compression, swelling, warmth, erythema.  Assessment: Patient presents with symptomatic onychomycosis; infection right hallux toenail  Plan: -Treatment options including alternatives, risks, complications were discussed -Nails sharply debrided 9 without complication/bleeding.  The name of the left hallux toenail debrided the other ones as a courtesy.  Only the left hallux toenail gets ingrown causing discomfort. -Correct the right hallux toenail there was localized infection noted.  I was able to sharply debride the loose portion of the nail back to the proximal nail fold.  After debridement there was a granular wound base without any lacerations or exposed bone.  Recommend washing with soap and water daily and dry thoroughly and apply a small amount of antibiotic ointment and a bandage.  Prescribed cephalexin.  Offloading.  Monitor closely for any signs or symptoms of worsening infection and call the office or report to  emergency department immediately should any occur.  Return in about 2 weeks (around 08/15/2021) for Right toenail check .  If wound still present x-ray right foot  Vivi Barrack DPM

## 2021-08-15 ENCOUNTER — Ambulatory Visit: Payer: Medicare HMO | Admitting: Podiatry

## 2021-08-15 ENCOUNTER — Other Ambulatory Visit: Payer: Self-pay

## 2021-08-15 DIAGNOSIS — L600 Ingrowing nail: Secondary | ICD-10-CM

## 2021-08-15 DIAGNOSIS — L97511 Non-pressure chronic ulcer of other part of right foot limited to breakdown of skin: Secondary | ICD-10-CM | POA: Diagnosis not present

## 2021-08-19 NOTE — Progress Notes (Signed)
Subjective: 88 y.o. returns the office today for follow-up evaluation of a wound, infection to his right big toe.  He states that overall he is doing much better it appears the wound is almost healed.  Denies any fevers or chills or nausea or vomiting.    PCP: Lavone Orn, MD Last A1c- 7.5 07/10/2020  Objective: AAO 3, NAD DP/PT pulses palpable, CRT less than 3 seconds.  Right hallux toenail with small superficial granular wound present there is no probing, undermining or tunneling.  Minimal edema.  No erythema or ascending cellulitis identified today.  There is no fluctuation or crepitation.  No malodor. No pain with calf compression, swelling, warmth, erythema.  Assessment: Right hallux toenail infection, improving  Plan: -Treatment options including alternatives, risks, complications were discussed -Patient states he is doing much better.  Wants order for x-rays.  Continue watch with soap and water daily and recommend a small amount of antibiotic ointment dressing changes daily.  Continue offloading.  Monitor for any signs or symptoms of infection worsening if there is any changes to let me know immediately.  Return in about 2 weeks (around 08/29/2021) for ulcer check toe.  Trula Slade DPM

## 2021-08-29 ENCOUNTER — Other Ambulatory Visit: Payer: Self-pay

## 2021-08-29 ENCOUNTER — Ambulatory Visit (INDEPENDENT_AMBULATORY_CARE_PROVIDER_SITE_OTHER): Payer: Medicare HMO | Admitting: Podiatry

## 2021-08-29 DIAGNOSIS — L97511 Non-pressure chronic ulcer of other part of right foot limited to breakdown of skin: Secondary | ICD-10-CM

## 2021-08-29 DIAGNOSIS — M2042 Other hammer toe(s) (acquired), left foot: Secondary | ICD-10-CM | POA: Diagnosis not present

## 2021-08-29 DIAGNOSIS — L97521 Non-pressure chronic ulcer of other part of left foot limited to breakdown of skin: Secondary | ICD-10-CM

## 2021-08-29 DIAGNOSIS — M2041 Other hammer toe(s) (acquired), right foot: Secondary | ICD-10-CM | POA: Diagnosis not present

## 2021-09-02 NOTE — Progress Notes (Signed)
Subjective: 88 y.o. returns the office today for follow-up evaluation of a wound, infection to his right big toe.  He states he is doing much better.  He is not seeing any open sores or any swelling or redness or any drainage.  He has no new concerns today.  No fevers or chills.   PCP: Kirby Funk, MD Last A1c- 7.5 07/10/2020  Objective: AAO 3, NAD DP/PT pulses palpable, CRT less than 3 seconds.  The ulceration is present on the right hallux toenail is healed.  There is no edema present today no new lesions about bilateral second toes at the distal aspect with superficial areas of skin breakdown. There is no edema, erythema or signs of infection.   Hammertoes present No pain with calf compression, swelling, warmth, erythema.  (Pictures did not save)  Assessment: Right hallux toenail infection, resolved with new lesions bilateral second toes secondary to hammertoes  Plan: -Treatment options including alternatives, risks, complications were discussed -Ulceration of the right hallux is resolved.  There are new lesions on bilateral second digits.  I dispensed offloading pads.  I did take a picture and showed this to him today.  He was not aware of this.  Discussed to have his wife check his feet daily.  If there is any worsening to let me know immediately.  Monitor closely for any signs or symptoms of infection.  Return in about 4 weeks (around 09/26/2021).  Vivi Barrack DPM

## 2021-09-05 DIAGNOSIS — N182 Chronic kidney disease, stage 2 (mild): Secondary | ICD-10-CM | POA: Diagnosis not present

## 2021-09-05 DIAGNOSIS — E1165 Type 2 diabetes mellitus with hyperglycemia: Secondary | ICD-10-CM | POA: Diagnosis not present

## 2021-09-05 DIAGNOSIS — E1169 Type 2 diabetes mellitus with other specified complication: Secondary | ICD-10-CM | POA: Diagnosis not present

## 2021-09-05 DIAGNOSIS — E7800 Pure hypercholesterolemia, unspecified: Secondary | ICD-10-CM | POA: Diagnosis not present

## 2021-09-05 DIAGNOSIS — I10 Essential (primary) hypertension: Secondary | ICD-10-CM | POA: Diagnosis not present

## 2021-09-05 DIAGNOSIS — E1121 Type 2 diabetes mellitus with diabetic nephropathy: Secondary | ICD-10-CM | POA: Diagnosis not present

## 2021-09-05 DIAGNOSIS — F325 Major depressive disorder, single episode, in full remission: Secondary | ICD-10-CM | POA: Diagnosis not present

## 2021-09-05 DIAGNOSIS — E1140 Type 2 diabetes mellitus with diabetic neuropathy, unspecified: Secondary | ICD-10-CM | POA: Diagnosis not present

## 2021-09-26 ENCOUNTER — Ambulatory Visit: Payer: Medicare HMO | Admitting: Podiatry

## 2021-09-26 ENCOUNTER — Other Ambulatory Visit: Payer: Self-pay

## 2021-09-26 DIAGNOSIS — M79674 Pain in right toe(s): Secondary | ICD-10-CM

## 2021-09-26 DIAGNOSIS — M79675 Pain in left toe(s): Secondary | ICD-10-CM

## 2021-09-26 DIAGNOSIS — E1149 Type 2 diabetes mellitus with other diabetic neurological complication: Secondary | ICD-10-CM | POA: Diagnosis not present

## 2021-09-26 DIAGNOSIS — B351 Tinea unguium: Secondary | ICD-10-CM

## 2021-09-29 NOTE — Progress Notes (Signed)
Subjective: 88 y.o. returns the office today for painful, elongated, thickened toenails which he cannot trim himself, as well as for wounds on his second toes bilaterally.  He denies any swelling redness or any drainage.  No new wounds that he has noticed.  No fevers or chills.  He has no other concerns.    PCP: Kirby Funk, MD last seen September 05, 2021 Last A1c- 7.5 07/10/2020  Objective: AAO 3, NAD DP/PT pulses palpable, CRT less than 3 seconds.  Nails are hypertrophic, dystrophic, brittle, discolored, elongated 10. No surrounding redness or drainage. Tenderness nails 1-5 bilaterally. No open lesions or pre-ulcerative lesions are identified today. Foot distal aspect of bilateral second toes are preulcerative lesions.  There is no ongoing ulceration drainage or signs of infection today.  The ulcerations are doing much better. No pain with calf compression, swelling, warmth, erythema.  Assessment: Patient presents with symptomatic onychomycosis; preulcerative lesions  Plan: -Treatment options including alternatives, risks, complications were discussed -Nails sharply debrided 10 without complication/bleeding.   -Continue offloading of the second toes any other digits.  Discussed importance of daily foot inspection, glucose control to help with any further ulcerations or issues.  Vivi Barrack DPM

## 2021-10-17 DIAGNOSIS — I5032 Chronic diastolic (congestive) heart failure: Secondary | ICD-10-CM | POA: Diagnosis not present

## 2021-11-07 ENCOUNTER — Ambulatory Visit: Payer: Medicare HMO | Admitting: Sports Medicine

## 2021-11-07 DIAGNOSIS — M779 Enthesopathy, unspecified: Secondary | ICD-10-CM

## 2021-11-07 DIAGNOSIS — M79674 Pain in right toe(s): Secondary | ICD-10-CM | POA: Diagnosis not present

## 2021-11-07 DIAGNOSIS — I739 Peripheral vascular disease, unspecified: Secondary | ICD-10-CM

## 2021-11-07 DIAGNOSIS — M778 Other enthesopathies, not elsewhere classified: Secondary | ICD-10-CM | POA: Diagnosis not present

## 2021-11-07 DIAGNOSIS — M109 Gout, unspecified: Secondary | ICD-10-CM

## 2021-11-07 NOTE — Progress Notes (Signed)
Subjective: ?Charles Bonilla is a 88 y.o. male patient who presents to office for evaluation of Right greater than left second toe pain that last night with redness and swelling at the distal end of the toe.  Patient denies any known trauma or injury and states that he gets some sharp shooting pains in both feet sometimes they cover can even touch the toes especially on the right as well last night.  Patient is assisted by wife who helps to report this history. ? ?Patient is diabetic blood sugar not recorded this visit last A1c 7.5 and last PCP Lavone Orn, MD visit September 05, 2021 ? ?Patient Active Problem List  ? Diagnosis Date Noted  ? Allergic rhinitis 01/29/2021  ? Chronic diastolic heart failure ([place]) 01/29/2021  ? Chronic kidney disease, stage 2 (mild) 01/29/2021  ? Constipation 01/29/2021  ? Diabetic peripheral neuropathy associated with type 2 diabetes mellitus ([place]) 01/29/2021  ? Diabetic renal disease ([place]) 01/29/2021  ? Erectile dysfunction due to arterial insufficiency 01/29/2021  ? Hearing loss 01/29/2021  ? Hyperglycemia due to type 2 diabetes mellitus ([place]) 01/29/2021  ? Long term (current) use of insulin ([place]) 01/29/2021  ? Major depression in complete remission ([place]) 01/29/2021  ? Male hypogonadism 01/29/2021  ? Morbid obesity ([place]) 01/29/2021  ? Type 2 diabetes mellitus with other specified complication ([place]) 87/86/7672  ? Body mass index (BMI) 45.0-49.9, adult ([place]) 07/30/2020  ? HNP (herniated nucleus pulposus), lumbar 07/10/2020  ? Right bundle branch block (RBBB) with left anterior hemiblock 06/22/2014  ? Hyperlipidemia   ? HTN (hypertension)   ? Erectile dysfunction   ? Depressed bipolar disorder ([place])   ? OSA (obstructive sleep apnea)   ? CAD (coronary artery disease)   ? DJD (degenerative joint disease)   ? ? ?Current Outpatient Medications on File Prior to Visit  ?Medication Sig Dispense Refill  ? Accu-Chek FastClix Lancets MISC USE TO TEST BLOOD SUGAR 1 TO 2 TIMES DAILY AND AS DIRECTED     ? amLODipine (NORVASC) 5 MG tablet Take 5 mg by mouth daily.    ? amLODipine (NORVASC) 5 MG tablet Take by mouth.    ? aspirin EC 81 MG tablet Take 81 mg by mouth at bedtime.     ? Blood Glucose Monitoring Suppl (ACCU-CHEK GUIDE ME) w/Device KIT See admin instructions.    ? cephALEXin (KEFLEX) 500 MG capsule Take 1 capsule (500 mg total) by mouth 3 (three) times daily. 30 capsule 0  ? Cholecalciferol (VITAMIN D PO) Take 50 mcg by mouth daily.     ? Cholecalciferol (VITAMIN D) 50 MCG (2000 UT) tablet 1 tablet    ? Cyanocobalamin (VITAMIN B12) 1000 MCG TBCR 1 tablet    ? docusate sodium (COLACE) 100 MG capsule 1 capsule as needed    ? docusate sodium (COLACE) 100 MG capsule 1 capsule as needed    ? DROPLET PEN NEEDLES 32G X 6 MM MISC     ? escitalopram (LEXAPRO) 20 MG tablet Take 20 mg by mouth daily.    ? furosemide (LASIX) 40 MG tablet 1 tablet    ? furosemide (LASIX) 40 MG tablet 1 tablet    ? glucose blood (ACCU-CHEK GUIDE) test strip See admin instructions.    ? Magnesium 250 MG TABS Take 250 mg by mouth daily.    ? metFORMIN (GLUCOPHAGE) 500 MG tablet Take 1,000 mg by mouth 2 (two) times daily with a meal.     ? metFORMIN (GLUCOPHAGE-XR) 500 MG 24 hr  tablet 2 tablets    ? MODERNA COVID-19 BIVAL BOOSTER 50 MCG/0.5ML injection     ? mupirocin ointment (BACTROBAN) 2 % Apply 1 application topically 2 (two) times daily. 30 g 0  ? NITROSTAT 0.4 MG SL tablet PLACE 1 TABLET UNDER THE TONGUE EVERY 5 MINUTES FOR CHEST PAIN,UP TO 3 TABLETS (Patient taking differently: Place 0.4 mg under the tongue every 5 (five) minutes as needed for chest pain.) 25 tablet 0  ? polyethylene glycol (MIRALAX) 17 g packet 1 packet mixed with 8 ounces of fluid    ? potassium chloride (KLOR-CON) 10 MEQ tablet 1 tablet with food    ? potassium chloride (KLOR-CON) 10 MEQ tablet 1 tablet with food    ? senna (SENOKOT) 8.6 MG tablet 2 tablets at bedtime as needed    ? simvastatin (ZOCOR) 20 MG tablet Take 20 mg by mouth daily at 6 PM.     ?  simvastatin (ZOCOR) 20 MG tablet Take 1 tablet by mouth every evening.    ? SOLIQUA 100-33 UNT-MCG/ML SOPN Inject 50 Units into the skin at bedtime.     ? spironolactone (ALDACTONE) 25 MG tablet Take 25 mg by mouth daily.    ? tiZANidine (ZANAFLEX) 4 MG tablet Take 1 tablet (4 mg total) by mouth every 6 (six) hours as needed for muscle spasms. 60 tablet 0  ? triamcinolone ointment (KENALOG) 0.5 % Apply 1 application topically daily. 30 g 0  ? vitamin B-12 (CYANOCOBALAMIN) 1000 MCG tablet Take 1,000 mcg by mouth daily.    ? vitamin E 180 MG (400 UNITS) capsule Take 400 Units by mouth daily.    ? ?No current facility-administered medications on file prior to visit.  ? ? ?No Known Allergies ? ?Objective:  ?General: Alert and oriented x3 in no acute distress ? ?Dermatology: Minimal focal swelling and redness noted at the distal interphalangeal joint of the right second toe without warmth, no ecchymosis bilateral, all nails x 10 are well manicured.  Recently trimmed last month. ? ?Vascular: Dorsalis Pedis and Posterior Tibial pedal pulses 1/4, however there is significant venous skin changes and trace swelling bilateral ankles. ? ?Neurology: Gross sensation intact via light touch bilateral, protective sensation diminished bilateral wrist pain likely monofilament. ? ?Musculoskeletal: There is tenderness with palpation at distal interphalangeal joint of the right second toe greater than the left with no obvious deformity besides long second toe. ? ?      ?Assessment and Plan: ?Problem List Items Addressed This Visit   ?None ?Visit Diagnoses   ? ? Capsulitis    -  Primary  ? Toe pain, right      ? Acute gout involving toe, unspecified cause, unspecified laterality      ? PVD (peripheral vascular disease) ([place])      ? ?  ?  ? ?-Complete examination performed ?-Discussed with patient possible contusion of the toe versus gout however at this time because it is localized redness and swelling at the distal interphalangeal joint  may have issue with gout ?-At this time wife did not want to start any medicine for any additional treatment she just wanted the toe to be checked to make sure there was no issues with source of which at today's visit I did not see ?-ABI screening tool performed in the office to reassure wife that there is no issue with his circulation as well. ?ABI 1.12 on the right and 1.07 on the left ?-Advised patient and wife continue with close monitoring  if symptoms fail to continue to improve especially at the right second toe by Monday to call office or return for reevaluation. ? ?Landis Martins, DPM ? ?

## 2021-11-07 NOTE — Patient Instructions (Signed)
Colchicine medication for gout ?

## 2021-11-08 DIAGNOSIS — I10 Essential (primary) hypertension: Secondary | ICD-10-CM | POA: Diagnosis not present

## 2021-11-08 DIAGNOSIS — E7800 Pure hypercholesterolemia, unspecified: Secondary | ICD-10-CM | POA: Diagnosis not present

## 2021-11-08 DIAGNOSIS — E1165 Type 2 diabetes mellitus with hyperglycemia: Secondary | ICD-10-CM | POA: Diagnosis not present

## 2021-11-19 DIAGNOSIS — Z5181 Encounter for therapeutic drug level monitoring: Secondary | ICD-10-CM | POA: Diagnosis not present

## 2021-11-19 DIAGNOSIS — Z7184 Encounter for health counseling related to travel: Secondary | ICD-10-CM | POA: Diagnosis not present

## 2021-11-19 DIAGNOSIS — E1169 Type 2 diabetes mellitus with other specified complication: Secondary | ICD-10-CM | POA: Diagnosis not present

## 2021-11-19 DIAGNOSIS — I5032 Chronic diastolic (congestive) heart failure: Secondary | ICD-10-CM | POA: Diagnosis not present

## 2021-11-19 DIAGNOSIS — Z794 Long term (current) use of insulin: Secondary | ICD-10-CM | POA: Diagnosis not present

## 2021-12-30 ENCOUNTER — Ambulatory Visit: Payer: Medicare HMO | Admitting: Podiatry

## 2021-12-30 DIAGNOSIS — E1149 Type 2 diabetes mellitus with other diabetic neurological complication: Secondary | ICD-10-CM | POA: Diagnosis not present

## 2021-12-30 DIAGNOSIS — M79674 Pain in right toe(s): Secondary | ICD-10-CM | POA: Diagnosis not present

## 2021-12-30 DIAGNOSIS — B351 Tinea unguium: Secondary | ICD-10-CM

## 2021-12-30 DIAGNOSIS — M79675 Pain in left toe(s): Secondary | ICD-10-CM

## 2022-01-01 NOTE — Progress Notes (Signed)
Subjective: 88 y.o. returns the office today for painful, elongated, thickened toenails which he cannot trim himself.  He was last in the office for redness and swelling in the tips of the toes without restaging March.  This is resolved.  Denies any open lesions at this time.  PCP: Kirby Funk, MD last seen September 05, 2021 Last A1c- 7.5  Objective: AAO 3, NAD DP/PT pulses palpable, CRT less than 3 seconds.  Sensation decreased with Semmes Weinstein monofilament. Nails are hypertrophic, dystrophic, brittle, discolored, elongated 10. No surrounding redness or drainage. Tenderness nails 1-5 bilaterally. No open lesions or pre-ulcerative lesions are identified today. Hammertoes present. No pain with calf compression, swelling, warmth, erythema.  Assessment: Patient presents with symptomatic onychomycosis  Plan: -Treatment options including alternatives, risks, complications were discussed -Nails sharply debrided 10 without complication/bleeding.   -Continue daily foot inspection, glucose control.  Vivi Barrack DPM

## 2022-01-13 DIAGNOSIS — E7800 Pure hypercholesterolemia, unspecified: Secondary | ICD-10-CM | POA: Diagnosis not present

## 2022-01-13 DIAGNOSIS — I10 Essential (primary) hypertension: Secondary | ICD-10-CM | POA: Diagnosis not present

## 2022-01-13 DIAGNOSIS — E1165 Type 2 diabetes mellitus with hyperglycemia: Secondary | ICD-10-CM | POA: Diagnosis not present

## 2022-01-13 DIAGNOSIS — G4733 Obstructive sleep apnea (adult) (pediatric): Secondary | ICD-10-CM | POA: Diagnosis not present

## 2022-01-13 DIAGNOSIS — E1140 Type 2 diabetes mellitus with diabetic neuropathy, unspecified: Secondary | ICD-10-CM | POA: Diagnosis not present

## 2022-01-13 DIAGNOSIS — Z794 Long term (current) use of insulin: Secondary | ICD-10-CM | POA: Diagnosis not present

## 2022-01-13 DIAGNOSIS — E1121 Type 2 diabetes mellitus with diabetic nephropathy: Secondary | ICD-10-CM | POA: Diagnosis not present

## 2022-01-13 DIAGNOSIS — Z0000 Encounter for general adult medical examination without abnormal findings: Secondary | ICD-10-CM | POA: Diagnosis not present

## 2022-01-13 DIAGNOSIS — N182 Chronic kidney disease, stage 2 (mild): Secondary | ICD-10-CM | POA: Diagnosis not present

## 2022-01-13 DIAGNOSIS — Z1331 Encounter for screening for depression: Secondary | ICD-10-CM | POA: Diagnosis not present

## 2022-01-31 DIAGNOSIS — H26492 Other secondary cataract, left eye: Secondary | ICD-10-CM | POA: Diagnosis not present

## 2022-01-31 DIAGNOSIS — H524 Presbyopia: Secondary | ICD-10-CM | POA: Diagnosis not present

## 2022-01-31 DIAGNOSIS — E119 Type 2 diabetes mellitus without complications: Secondary | ICD-10-CM | POA: Diagnosis not present

## 2022-02-26 DIAGNOSIS — H26492 Other secondary cataract, left eye: Secondary | ICD-10-CM | POA: Diagnosis not present

## 2022-03-14 DIAGNOSIS — E1121 Type 2 diabetes mellitus with diabetic nephropathy: Secondary | ICD-10-CM | POA: Diagnosis not present

## 2022-03-14 DIAGNOSIS — E7800 Pure hypercholesterolemia, unspecified: Secondary | ICD-10-CM | POA: Diagnosis not present

## 2022-03-14 DIAGNOSIS — N182 Chronic kidney disease, stage 2 (mild): Secondary | ICD-10-CM | POA: Diagnosis not present

## 2022-03-14 DIAGNOSIS — I10 Essential (primary) hypertension: Secondary | ICD-10-CM | POA: Diagnosis not present

## 2022-04-01 ENCOUNTER — Ambulatory Visit: Payer: Medicare HMO | Admitting: Podiatry

## 2022-04-01 DIAGNOSIS — B351 Tinea unguium: Secondary | ICD-10-CM | POA: Diagnosis not present

## 2022-04-01 DIAGNOSIS — E1149 Type 2 diabetes mellitus with other diabetic neurological complication: Secondary | ICD-10-CM

## 2022-04-01 DIAGNOSIS — M79675 Pain in left toe(s): Secondary | ICD-10-CM | POA: Diagnosis not present

## 2022-04-01 DIAGNOSIS — M79674 Pain in right toe(s): Secondary | ICD-10-CM | POA: Diagnosis not present

## 2022-04-08 NOTE — Progress Notes (Signed)
Subjective: 88 y.o. returns the office today for painful, elongated, thickened toenails which he cannot trim himself.  No open lesions he reports  PCP: Kirby Funk, MD last seen September 05, 2021  Objective: AAO 3, NAD DP/PT pulses palpable, CRT less than 3 seconds.  Sensation decreased with Semmes Weinstein monofilament. Nails are hypertrophic, dystrophic, brittle, discolored, elongated 10. No surrounding redness or drainage. Tenderness nails 1-5 bilaterally. No open lesions or pre-ulcerative lesions are identified today. Hammertoes present. No pain with calf compression, swelling, warmth, erythema.  Assessment: Patient presents with symptomatic onychomycosis  Plan: -Treatment options including alternatives, risks, complications were discussed -Nails sharply debrided 10 without complication/bleeding.   -Continue daily foot inspection, glucose control.  Vivi Barrack DPM

## 2022-04-18 DIAGNOSIS — F325 Major depressive disorder, single episode, in full remission: Secondary | ICD-10-CM | POA: Diagnosis not present

## 2022-04-18 DIAGNOSIS — I10 Essential (primary) hypertension: Secondary | ICD-10-CM | POA: Diagnosis not present

## 2022-04-18 DIAGNOSIS — E7800 Pure hypercholesterolemia, unspecified: Secondary | ICD-10-CM | POA: Diagnosis not present

## 2022-04-18 DIAGNOSIS — E1140 Type 2 diabetes mellitus with diabetic neuropathy, unspecified: Secondary | ICD-10-CM | POA: Diagnosis not present

## 2022-05-24 NOTE — Progress Notes (Unsigned)
Cardiology Office Note:    Date:  05/26/2022   ID:  Charles Bonilla, DOB [birth year], MRN [id number]  PCP:  Lavone Orn, MD  Cardiologist:  Sinclair Grooms, MD   Referring MD: Lavone Orn, MD   No chief complaint on file.   History of Present Illness:    Charles Bonilla is a 88 y.o. male with a hx of bare-metal stent (01/2002), hypertension, hyperlipidemia, OSA,  and diabetes mellitus II.   No specific cardiac complaints.  Says he is slowing down as he gets older.  He has not lost any significant weight.  Denies orthopnea and PND.  No peripheral edema.  No angina.  Overall feels that things are going relatively well.  Past Medical History:  Diagnosis Date   CAD (coronary artery disease)     PTCA and stent March 2003   Constipation    Coronary atherosclerosis of unspecified type of vessel, native or graft    Depression    Diabetes mellitus without complication ([place])    type 2   DJD (degenerative joint disease)    Dr. Berenice Primas   Erectile dysfunction    HTN (hypertension)    Hyperlipidemia    OSA (obstructive sleep apnea)    cpap- uses nightly   Other testicular hypofunction     Past Surgical History:  Procedure Laterality Date   CARDIAC CATHETERIZATION  2003   COLONOSCOPY     EYE SURGERY Bilateral    cataracts removed   L knee replacement, [name]     LUMBAR LAMINECTOMY/DECOMPRESSION MICRODISCECTOMY Left 07/10/2020   Procedure: Left Lumbar two-three microdiscectomy;  Surgeon: Ashok Pall, MD;  Location: [place];  Service: Neurosurgery;  Laterality: Left;   R knee replacement, [place] Camel     WISDOM TOOTH EXTRACTION      Current Medications: Current Meds  Medication Sig   Accu-Chek FastClix Lancets MISC USE TO TEST BLOOD SUGAR 1 TO 2 TIMES DAILY AND AS DIRECTED   amLODipine (NORVASC) 5 MG tablet Take 5 mg by mouth daily.   aspirin EC 81 MG tablet Take 81 mg by mouth at bedtime.    Blood Glucose Monitoring Suppl (ACCU-CHEK GUIDE ME) w/Device KIT See admin  instructions.   Cholecalciferol (VITAMIN D PO) Take 50 mcg by mouth daily.    Cyanocobalamin (VITAMIN B12) 1000 MCG TBCR 1 tablet   docusate sodium (COLACE) 100 MG capsule 1 capsule as needed   DROPLET PEN NEEDLES 32G X 6 MM MISC    escitalopram (LEXAPRO) 20 MG tablet Take 20 mg by mouth daily.   glucose blood (ACCU-CHEK GUIDE) test strip See admin instructions.   JARDIANCE 10 MG TABS tablet Take 10 mg by mouth daily.   Magnesium 250 MG TABS Take 250 mg by mouth daily.   metFORMIN (GLUCOPHAGE) 500 MG tablet Take 1,000 mg by mouth 2 (two) times daily with a meal.    MODERNA COVID-19 BIVAL BOOSTER 50 MCG/0.5ML injection    NITROSTAT 0.4 MG SL tablet PLACE 1 TABLET UNDER THE TONGUE EVERY 5 MINUTES FOR CHEST PAIN,UP TO 3 TABLETS (Patient taking differently: Place 0.4 mg under the tongue every 5 (five) minutes as needed for chest pain.)   potassium chloride (KLOR-CON) 10 MEQ tablet Take 10 mEq by mouth daily.   simvastatin (ZOCOR) 20 MG tablet Take 1 tablet by mouth every evening.   SOLIQUA 100-33 UNT-MCG/ML SOPN Inject 50 Units into the skin at bedtime.    spironolactone (ALDACTONE) 25 MG tablet Take 25 mg by mouth daily.  vitamin E 180 MG (400 UNITS) capsule Take 400 Units by mouth daily.     Allergies:   Patient has no known allergies.   Social History   Socioeconomic History   Marital status: Married    Spouse name: Not on file   Number of children: Not on file   Years of education: Not on file   Highest education level: Not on file  Occupational History   Not on file  Tobacco Use   Smoking status: Former   Smokeless tobacco: Never   Tobacco comments:    QUIT 30 YRS AGO  Vaping Use   Vaping Use: Never used  Substance and Sexual Activity   Alcohol use: Yes    Alcohol/week: 8.0 - 15.0 standard drinks of alcohol    Types: 7 - 14 Glasses of wine, 1 Shots of liquor per week    Comment: EVERY DAY WINE 1-2/day   Drug use: No   Sexual activity: Not on file  Other Topics Concern    Not on file  Social History Narrative   Not on file   Social Determinants of Health   Financial Resource Strain: Not on file  Food Insecurity: Not on file  Transportation Needs: Not on file  Physical Activity: Not on file  Stress: Not on file  Social Connections: Not on file     Family History: The patient's family history is not on file.  ROS:   Please see the history of present illness.    No chest pain or changes.  All other systems reviewed and are negative.  EKGs/Labs/Other Studies Reviewed:    The following studies were reviewed today: No recent imaging since the early 2000's.  EKG:  EKG right bundle, left anterior hemiblock.  Normal sinus rhythm.  When compared to the prior tracing from June 2021, no significant changes noted.  Recent Labs: No results found for requested labs within last 365 days.  Recent Lipid Panel No results found for: "CHOL", "TRIG", "HDL", "CHOLHDL", "VLDL", "LDLCALC", "LDLDIRECT"  Physical Exam:    VS:  BP 108/64   Pulse 63   Ht $R'5\' 11"'nB$  (1.803 m)   Wt (!) 315 lb 3.2 oz (143 kg)   SpO2 96%   BMI 43.96 kg/m     Wt Readings from Last 3 Encounters:  05/26/22 (!) 315 lb 3.2 oz (143 kg)  07/10/20 (!) 315 lb 0.6 oz (142.9 kg)  02/06/20 (!) 321 lb 12.8 oz (146 kg)     GEN: Morbid obesity. No acute distress HEENT: Normal NECK: No JVD. LYMPHATICS: No lymphadenopathy CARDIAC: 2/6 right upper sternal systolic murmur with radiation into the subclavicular region.  No diastolic murmur. RRR no gallop, or edema. VASCULAR:  Normal Pulses. No bruits. RESPIRATORY:  Clear to auscultation without rales, wheezing or rhonchi  ABDOMEN: Soft, non-tender, non-distended, No pulsatile mass, MUSCULOSKELETAL: No deformity  SKIN: Warm and dry NEUROLOGIC:  Alert and oriented x 3 PSYCHIATRIC:  Normal affect   ASSESSMENT:    1. Coronary artery disease involving native coronary artery of native heart without angina pectoris   2. Systolic murmur   3.  Primary hypertension   4. Other hyperlipidemia   5. Right bundle branch block (RBBB) with left anterior hemiblock   6. Chronic kidney disease, stage 2 (mild)   7. Long term (current) use of insulin ([place])    PLAN:    In order of problems listed above:  Continue aggressive risk factor control.  He is becoming more more difficult for  the patient to get physical activity because of arthritis and obesity. Rule out aortic stenosis with 2D Doppler echocardiogram.  This will also help [place] to assess LV function. Blood pressure is very well controlled Lipids are very well controlled on Zocor.  Low-fat diet recommended. No changes noted.   2D Doppler echocardiogram to determine if there is a signal for aortic stenosis based upon the systolic murmur heard today.   Medication Adjustments/Labs and Tests Ordered: Current medicines are reviewed at length with the patient today.  Concerns regarding medicines are outlined above.  Orders Placed This Encounter  Procedures   ECHOCARDIOGRAM COMPLETE   No orders of the defined types were placed in this encounter.   Patient Instructions  Medication Instructions:  Your physician recommends that you continue on your current medications as directed. Please refer to the Current Medication list given to you today.  *If you need a refill on your cardiac medications before your next appointment, please call your pharmacy*  Lab Work: NONE  Testing/Procedures: Your physician has requested that you have an echocardiogram. Echocardiography is a painless test that uses sound waves to create images of your heart. It provides your doctor with information about the size and shape of your heart and how well your heart's chambers and valves are working. This procedure takes approximately one hour. There are no restrictions for this procedure. Please do NOT wear cologne, perfume, aftershave, or lotions (deodorant is allowed). Please arrive 15 minutes prior to your  appointment time.  Follow-Up: At [facility], you and your health needs are our priority.  As part of our continuing mission to provide you with exceptional heart care, we have created designated Provider Care Teams.  These Care Teams include your primary Cardiologist (physician) and Advanced Practice Providers (APPs -  Physician Assistants and Nurse Practitioners) who all work together to provide you with the care you need, when you need it.  Your next appointment:   1 year(s)  The format for your next appointment:   In Person  Provider:   Sinclair Grooms, MD   Important Information About Sugar         Signed, Sinclair Grooms, MD  05/26/2022 4:43 PM    [place]

## 2022-05-26 ENCOUNTER — Encounter: Payer: Self-pay | Admitting: Interventional Cardiology

## 2022-05-26 ENCOUNTER — Ambulatory Visit: Payer: Medicare HMO | Attending: Interventional Cardiology | Admitting: Interventional Cardiology

## 2022-05-26 VITALS — BP 108/64 | HR 63 | Ht 71.0 in | Wt 315.2 lb

## 2022-05-26 DIAGNOSIS — Z794 Long term (current) use of insulin: Secondary | ICD-10-CM

## 2022-05-26 DIAGNOSIS — I452 Bifascicular block: Secondary | ICD-10-CM | POA: Diagnosis not present

## 2022-05-26 DIAGNOSIS — I10 Essential (primary) hypertension: Secondary | ICD-10-CM

## 2022-05-26 DIAGNOSIS — I2510 Atherosclerotic heart disease of native coronary artery without angina pectoris: Secondary | ICD-10-CM | POA: Diagnosis not present

## 2022-05-26 DIAGNOSIS — N182 Chronic kidney disease, stage 2 (mild): Secondary | ICD-10-CM | POA: Diagnosis not present

## 2022-05-26 DIAGNOSIS — R011 Cardiac murmur, unspecified: Secondary | ICD-10-CM | POA: Diagnosis not present

## 2022-05-26 DIAGNOSIS — E7849 Other hyperlipidemia: Secondary | ICD-10-CM | POA: Diagnosis not present

## 2022-05-26 NOTE — Patient Instructions (Signed)
Medication Instructions:  Your physician recommends that you continue on your current medications as directed. Please refer to the Current Medication list given to you today.  *If you need a refill on your cardiac medications before your next appointment, please call your pharmacy*  Lab Work: NONE  Testing/Procedures: Your physician has requested that you have an echocardiogram. Echocardiography is a painless test that uses sound waves to create images of your heart. It provides your doctor with information about the size and shape of your heart and how well your heart's chambers and valves are working. This procedure takes approximately one hour. There are no restrictions for this procedure. Please do NOT wear cologne, perfume, aftershave, or lotions (deodorant is allowed). Please arrive 15 minutes prior to your appointment time.  Follow-Up: At [facility], you and your health needs are our priority.  As part of our continuing mission to provide you with exceptional heart care, we have created designated Provider Care Teams.  These Care Teams include your primary Cardiologist (physician) and Advanced Practice Providers (APPs -  Physician Assistants and Nurse Practitioners) who all work together to provide you with the care you need, when you need it.  Your next appointment:   1 year(s)  The format for your next appointment:   In Person  Provider:   Sinclair Grooms, MD   Important Information About Sugar

## 2022-05-27 NOTE — Addendum Note (Signed)
Addended by: Danielle Dess on: 05/27/2022 10:59 AM   Modules accepted: Orders

## 2022-05-28 NOTE — Addendum Note (Signed)
Addended byDanielle Dess on: 05/28/2022 09:36 AM   Modules accepted: Orders

## 2022-06-03 DIAGNOSIS — I2510 Atherosclerotic heart disease of native coronary artery without angina pectoris: Secondary | ICD-10-CM | POA: Diagnosis not present

## 2022-06-03 DIAGNOSIS — E119 Type 2 diabetes mellitus without complications: Secondary | ICD-10-CM | POA: Diagnosis not present

## 2022-06-03 DIAGNOSIS — I5032 Chronic diastolic (congestive) heart failure: Secondary | ICD-10-CM | POA: Diagnosis not present

## 2022-06-10 ENCOUNTER — Ambulatory Visit (HOSPITAL_COMMUNITY): Payer: Medicare HMO | Attending: Interventional Cardiology

## 2022-06-10 DIAGNOSIS — R011 Cardiac murmur, unspecified: Secondary | ICD-10-CM | POA: Diagnosis not present

## 2022-06-10 LAB — ECHOCARDIOGRAM COMPLETE
AR max vel: 3.47 cm2
AV Area VTI: 3.59 cm2
AV Area mean vel: 3.36 cm2
AV Mean grad: 8 mmHg
AV Peak grad: 14.4 mmHg
Ao pk vel: 1.9 m/s
Area-P 1/2: 3.99 cm2
S' Lateral: 2.1 cm

## 2022-07-01 ENCOUNTER — Ambulatory Visit: Payer: Medicare HMO | Admitting: Podiatry

## 2022-07-01 DIAGNOSIS — M79674 Pain in right toe(s): Secondary | ICD-10-CM | POA: Diagnosis not present

## 2022-07-01 DIAGNOSIS — B351 Tinea unguium: Secondary | ICD-10-CM

## 2022-07-01 DIAGNOSIS — M79675 Pain in left toe(s): Secondary | ICD-10-CM | POA: Diagnosis not present

## 2022-07-01 DIAGNOSIS — E1149 Type 2 diabetes mellitus with other diabetic neurological complication: Secondary | ICD-10-CM | POA: Diagnosis not present

## 2022-07-08 NOTE — Progress Notes (Signed)
Subjective: 88 y.o. returns the office today for painful, elongated, thickened toenails which he cannot trim himself.  No open lesions he reports  PCP: Kirby Funk, MD   Objective: AAO 3, NAD DP/PT pulses palpable, CRT less than 3 seconds.  Sensation decreased with Semmes Weinstein monofilament. Nails are hypertrophic, dystrophic, brittle, discolored, elongated 10. No surrounding redness or drainage. Tenderness nails 1-5 bilaterally. No open lesions or pre-ulcerative lesions are identified today. Hammertoes present. No pain with calf compression, swelling, warmth, erythema.  Assessment: Patient presents with symptomatic onychomycosis  Plan: -Treatment options including alternatives, risks, complications were discussed -Nails sharply debrided 10 without complication/bleeding.   -Continue daily foot inspection, glucose control.  Vivi Barrack DPM

## 2022-09-18 DIAGNOSIS — I5032 Chronic diastolic (congestive) heart failure: Secondary | ICD-10-CM | POA: Diagnosis not present

## 2022-09-18 DIAGNOSIS — I10 Essential (primary) hypertension: Secondary | ICD-10-CM | POA: Diagnosis not present

## 2022-09-18 DIAGNOSIS — E1140 Type 2 diabetes mellitus with diabetic neuropathy, unspecified: Secondary | ICD-10-CM | POA: Diagnosis not present

## 2022-09-18 DIAGNOSIS — R69 Illness, unspecified: Secondary | ICD-10-CM | POA: Diagnosis not present

## 2022-09-18 DIAGNOSIS — E7800 Pure hypercholesterolemia, unspecified: Secondary | ICD-10-CM | POA: Diagnosis not present

## 2022-09-23 DIAGNOSIS — M25561 Pain in right knee: Secondary | ICD-10-CM | POA: Diagnosis not present

## 2022-09-30 ENCOUNTER — Ambulatory Visit: Payer: Medicare HMO | Admitting: Podiatry

## 2022-09-30 DIAGNOSIS — M79675 Pain in left toe(s): Secondary | ICD-10-CM | POA: Diagnosis not present

## 2022-09-30 DIAGNOSIS — M79674 Pain in right toe(s): Secondary | ICD-10-CM

## 2022-09-30 DIAGNOSIS — E1149 Type 2 diabetes mellitus with other diabetic neurological complication: Secondary | ICD-10-CM | POA: Diagnosis not present

## 2022-09-30 DIAGNOSIS — B351 Tinea unguium: Secondary | ICD-10-CM | POA: Diagnosis not present

## 2022-09-30 NOTE — Progress Notes (Signed)
Subjective: Chief Complaint  Patient presents with   Nail Problem    Rm 13 RFC Bilateral nail trim.     89 y.o. returns the office today for painful, elongated, thickened toenails which he cannot trim himself.  States the side of the big toenail is starting to hurt.  No open lesions.  No drainage or pus.  No swelling.  PCP: Lavone Orn, MD   Objective: AAO 3, NAD DP/PT pulses palpable, CRT less than 3 seconds.  Sensation decreased with Semmes Weinstein monofilament. Nails are hypertrophic, dystrophic, brittle, discolored, elongated 10. No surrounding redness or drainage. Tenderness nails 1-5 bilaterally. No open lesions or pre-ulcerative lesions are identified today. Hammertoes present. No pain with calf compression, swelling, warmth, erythema.  Assessment: Patient presents with symptomatic onychomycosis  Plan: -Treatment options including alternatives, risks, complications were discussed -Nails sharply debrided 10 without complication/bleeding.  Was able to sharply debride the corners of the hallux toenails are starting to ingrown.  Monitor for any signs or symptoms of infection. -Continue daily foot inspection, glucose control.  Trula Slade DPM

## 2022-10-08 DIAGNOSIS — E1165 Type 2 diabetes mellitus with hyperglycemia: Secondary | ICD-10-CM | POA: Diagnosis not present

## 2022-12-01 DIAGNOSIS — G4733 Obstructive sleep apnea (adult) (pediatric): Secondary | ICD-10-CM | POA: Diagnosis not present

## 2022-12-01 DIAGNOSIS — E7800 Pure hypercholesterolemia, unspecified: Secondary | ICD-10-CM | POA: Diagnosis not present

## 2022-12-01 DIAGNOSIS — I2510 Atherosclerotic heart disease of native coronary artery without angina pectoris: Secondary | ICD-10-CM | POA: Diagnosis not present

## 2022-12-01 DIAGNOSIS — I5032 Chronic diastolic (congestive) heart failure: Secondary | ICD-10-CM | POA: Diagnosis not present

## 2022-12-01 DIAGNOSIS — E119 Type 2 diabetes mellitus without complications: Secondary | ICD-10-CM | POA: Diagnosis not present

## 2022-12-01 DIAGNOSIS — I110 Hypertensive heart disease with heart failure: Secondary | ICD-10-CM | POA: Diagnosis not present

## 2022-12-01 DIAGNOSIS — I700 Atherosclerosis of aorta: Secondary | ICD-10-CM | POA: Diagnosis not present

## 2022-12-01 DIAGNOSIS — Z6841 Body Mass Index (BMI) 40.0 and over, adult: Secondary | ICD-10-CM | POA: Diagnosis not present

## 2022-12-29 ENCOUNTER — Ambulatory Visit: Payer: Medicare HMO | Admitting: Podiatry

## 2023-01-15 DIAGNOSIS — B353 Tinea pedis: Secondary | ICD-10-CM | POA: Diagnosis not present

## 2023-01-15 DIAGNOSIS — D225 Melanocytic nevi of trunk: Secondary | ICD-10-CM | POA: Diagnosis not present

## 2023-01-15 DIAGNOSIS — D485 Neoplasm of uncertain behavior of skin: Secondary | ICD-10-CM | POA: Diagnosis not present

## 2023-01-15 DIAGNOSIS — L821 Other seborrheic keratosis: Secondary | ICD-10-CM | POA: Diagnosis not present

## 2023-01-15 DIAGNOSIS — D0422 Carcinoma in situ of skin of left ear and external auricular canal: Secondary | ICD-10-CM | POA: Diagnosis not present

## 2023-01-15 DIAGNOSIS — D2261 Melanocytic nevi of right upper limb, including shoulder: Secondary | ICD-10-CM | POA: Diagnosis not present

## 2023-01-21 DIAGNOSIS — E119 Type 2 diabetes mellitus without complications: Secondary | ICD-10-CM | POA: Diagnosis not present

## 2023-01-21 DIAGNOSIS — I5032 Chronic diastolic (congestive) heart failure: Secondary | ICD-10-CM | POA: Diagnosis not present

## 2023-01-21 DIAGNOSIS — G4733 Obstructive sleep apnea (adult) (pediatric): Secondary | ICD-10-CM | POA: Diagnosis not present

## 2023-01-21 DIAGNOSIS — Z23 Encounter for immunization: Secondary | ICD-10-CM | POA: Diagnosis not present

## 2023-01-21 DIAGNOSIS — E261 Secondary hyperaldosteronism: Secondary | ICD-10-CM | POA: Diagnosis not present

## 2023-01-21 DIAGNOSIS — E7800 Pure hypercholesterolemia, unspecified: Secondary | ICD-10-CM | POA: Diagnosis not present

## 2023-01-21 DIAGNOSIS — J449 Chronic obstructive pulmonary disease, unspecified: Secondary | ICD-10-CM | POA: Diagnosis not present

## 2023-01-21 DIAGNOSIS — Z6841 Body Mass Index (BMI) 40.0 and over, adult: Secondary | ICD-10-CM | POA: Diagnosis not present

## 2023-01-21 DIAGNOSIS — Z0000 Encounter for general adult medical examination without abnormal findings: Secondary | ICD-10-CM | POA: Diagnosis not present

## 2023-01-21 DIAGNOSIS — I110 Hypertensive heart disease with heart failure: Secondary | ICD-10-CM | POA: Diagnosis not present

## 2023-01-21 DIAGNOSIS — Z125 Encounter for screening for malignant neoplasm of prostate: Secondary | ICD-10-CM | POA: Diagnosis not present

## 2023-01-21 DIAGNOSIS — I700 Atherosclerosis of aorta: Secondary | ICD-10-CM | POA: Diagnosis not present

## 2023-01-21 DIAGNOSIS — E1169 Type 2 diabetes mellitus with other specified complication: Secondary | ICD-10-CM | POA: Diagnosis not present

## 2023-01-29 DIAGNOSIS — D0422 Carcinoma in situ of skin of left ear and external auricular canal: Secondary | ICD-10-CM | POA: Diagnosis not present

## 2023-02-02 ENCOUNTER — Ambulatory Visit: Payer: Medicare HMO | Admitting: Podiatry

## 2023-02-02 DIAGNOSIS — E1149 Type 2 diabetes mellitus with other diabetic neurological complication: Secondary | ICD-10-CM | POA: Diagnosis not present

## 2023-02-02 DIAGNOSIS — M79675 Pain in left toe(s): Secondary | ICD-10-CM | POA: Diagnosis not present

## 2023-02-02 DIAGNOSIS — M79674 Pain in right toe(s): Secondary | ICD-10-CM | POA: Diagnosis not present

## 2023-02-02 DIAGNOSIS — B351 Tinea unguium: Secondary | ICD-10-CM

## 2023-02-04 NOTE — Progress Notes (Signed)
Subjective: Chief Complaint  Patient presents with   Nail Problem    Pt is here for a nail follow up from dermatophytosis of nail    89 y.o. returns the office today for painful, elongated, thickened toenails which he cannot trim himself.  States the side of the big toenail is starting to hurt.  No open lesions.  No drainage or pus.  No swelling.  PCP: Kirby Funk, MD   Objective: AAO 3, NAD DP/PT pulses palpable, CRT less than 3 seconds Sensation decreased with Semmes Weinstein monofilament. Nails are hypertrophic, dystrophic, brittle, discolored, elongated 10. No surrounding redness or drainage. Tenderness nails 1-5 bilaterally. No open lesions or pre-ulcerative lesions are identified today. Hammertoes present. No pain with calf compression, swelling, warmth, erythema.  Assessment: Patient presents with symptomatic onychomycosis  Plan: -Treatment options including alternatives, risks, complications were discussed -Nails sharply debrided 10 without complication/bleeding.   -Continue daily foot inspection, glucose control.  Vivi Barrack DPM

## 2023-02-16 DIAGNOSIS — G4733 Obstructive sleep apnea (adult) (pediatric): Secondary | ICD-10-CM | POA: Diagnosis not present

## 2023-02-16 DIAGNOSIS — E1165 Type 2 diabetes mellitus with hyperglycemia: Secondary | ICD-10-CM | POA: Diagnosis not present

## 2023-03-19 DIAGNOSIS — H5203 Hypermetropia, bilateral: Secondary | ICD-10-CM | POA: Diagnosis not present

## 2023-03-19 DIAGNOSIS — Z961 Presence of intraocular lens: Secondary | ICD-10-CM | POA: Diagnosis not present

## 2023-03-19 DIAGNOSIS — E119 Type 2 diabetes mellitus without complications: Secondary | ICD-10-CM | POA: Diagnosis not present

## 2023-04-23 DIAGNOSIS — G4733 Obstructive sleep apnea (adult) (pediatric): Secondary | ICD-10-CM | POA: Diagnosis not present

## 2023-04-30 NOTE — Progress Notes (Signed)
Cardiology Office Note   Date:  05/01/2023   ID:  Charles Bonilla 1933/11/28, MRN [id number]  PCP:  Kirby Funk, MD (Inactive)    No chief complaint on file.  CAD  Wt Readings from Last 3 Encounters:  05/01/23 (!) 308 lb (139.7 kg)  05/26/22 (!) 315 lb 3.2 oz (143 kg)  07/10/20 (!) 315 lb 0.6 oz (142.9 kg)       History of Present Illness: Charles Bonilla is a 89 y.o. male former patient of Dr. Katrinka Blazing with a hx of bare-metal stent (01/2002), hypertension, hyperlipidemia, OSA,  and diabetes mellitus II.   Echo in 2023 showed: "  1. Left ventricular ejection fraction, by estimation, is 70 to 75%. Left  ventricular ejection fraction by 3D volume is 71 %. The left ventricle has  hyperdynamic function. The left ventricle has no regional wall motion  abnormalities. There is moderate  left ventricular hypertrophy. Left ventricular diastolic parameters are  consistent with Grade I diastolic dysfunction (impaired relaxation).   2. Right ventricular systolic function is normal. The right ventricular  size is normal. Tricuspid regurgitation signal is inadequate for assessing  PA pressure.   3. The mitral valve is abnormal. Trivial mitral valve regurgitation.   4. The aortic valve is tricuspid. There is moderate calcification of the  aortic valve. Aortic valve regurgitation is not visualized. Aortic valve  sclerosis/calcification is present, without any evidence of aortic  stenosis. Aortic valve mean gradient  measures 8.0 mmHg.   5. Aortic dilatation noted. There is mild dilatation of the ascending  aorta, measuring 40 mm. "  Denies : Chest pain. Dizziness. Leg edema. Nitroglycerin use. Orthopnea. Palpitations. Paroxysmal nocturnal dyspnea. Shortness of breath. Syncope.     Past Medical History:  Diagnosis Date   CAD (coronary artery disease)     PTCA and stent March 2003   Constipation    Coronary atherosclerosis of unspecified type of vessel, native or graft     Depression    Diabetes mellitus without complication ([facility])    type 2   DJD (degenerative joint disease)    Dr. Luiz Blare   Erectile dysfunction    HTN (hypertension)    Hyperlipidemia    OSA (obstructive sleep apnea)    cpap- uses nightly   Other testicular hypofunction     Past Surgical History:  Procedure Laterality Date   CARDIAC CATHETERIZATION  2003   COLONOSCOPY     EYE SURGERY Bilateral    cataracts removed   L knee replacement, [name]     LUMBAR LAMINECTOMY/DECOMPRESSION MICRODISCECTOMY Left 07/10/2020   Procedure: Left Lumbar two-three microdiscectomy;  Surgeon: Coletta Memos, MD;  Location: [facility] OR;  Service: Neurosurgery;  Laterality: Left;   R knee replacement, [name]     WISDOM TOOTH EXTRACTION       Current Outpatient Medications  Medication Sig Dispense Refill   Accu-Chek FastClix Lancets MISC USE TO TEST BLOOD SUGAR 1 TO 2 TIMES DAILY AND AS DIRECTED     amLODipine (NORVASC) 5 MG tablet Take 5 mg by mouth daily.     aspirin EC 81 MG tablet Take 81 mg by mouth at bedtime.      Blood Glucose Monitoring Suppl (ACCU-CHEK GUIDE ME) w/Device KIT See admin instructions.     Cholecalciferol (VITAMIN D PO) Take 50 mcg by mouth daily.      Cyanocobalamin (VITAMIN B12) 1000 MCG TBCR 1 tablet     docusate sodium (COLACE) 100 MG capsule 1 capsule as  needed     DROPLET PEN NEEDLES 32G X 6 MM MISC      escitalopram (LEXAPRO) 20 MG tablet Take 20 mg by mouth daily.     glucose blood (ACCU-CHEK GUIDE) test strip See admin instructions.     JARDIANCE 25 MG TABS tablet Take 12.5 mg by mouth daily.     Magnesium 250 MG TABS Take 250 mg by mouth daily.     metFORMIN (GLUCOPHAGE-XR) 500 MG 24 hr tablet Take 1,000 mg by mouth 2 (two) times daily.     MODERNA COVID-19 BIVAL BOOSTER 50 MCG/0.5ML injection      NITROSTAT 0.4 MG SL tablet PLACE 1 TABLET UNDER THE TONGUE EVERY 5 MINUTES FOR CHEST PAIN,UP TO 3 TABLETS (Patient taking differently: Place 0.4 mg under the tongue every 5 (five)  minutes as needed for chest pain.) 25 tablet 0   potassium chloride (KLOR-CON) 10 MEQ tablet Take 10 mEq by mouth daily.     simvastatin (ZOCOR) 20 MG tablet Take 1 tablet by mouth every evening.     SOLIQUA 100-33 UNT-MCG/ML SOPN Inject 50 Units into the skin at bedtime.      spironolactone (ALDACTONE) 25 MG tablet Take 25 mg by mouth daily.     vitamin E 180 MG (400 UNITS) capsule Take 400 Units by mouth daily.     No current facility-administered medications for this visit.    Allergies:   Patient has no known allergies.    Social History:  The patient  reports that he has quit smoking. He has never used smokeless tobacco. He reports current alcohol use of about 8.0 - 15.0 standard drinks of alcohol per week. He reports that he does not use drugs.   Family History:  The patient's family history includes Cancer in his sister; Heart disease in his father.    ROS:  Please see the history of present illness.   Otherwise, review of systems are positive for back pain.   All other systems are reviewed and negative.    PHYSICAL EXAM: VS:  BP (!) 116/56   Pulse 72   Ht 5' 11.5" (1.816 m)   Wt (!) 308 lb (139.7 kg)   SpO2 94%   BMI 42.36 kg/m  , BMI Body mass index is 42.36 kg/m. GEN: Well nourished, well developed, in no acute distress HEENT: normal Neck: no JVD, carotid bruits, or masses Cardiac: RRR; no murmurs, rubs, or gallops,no edema  Respiratory:  clear to auscultation bilaterally, normal work of breathing GI: soft, nontender, nondistended, + BS MS: no deformity or atrophy Skin: warm and dry, venous stasis rash bilaterally Neuro:  Strength and sensation are intact Psych: euthymic mood, full affect   EKG:   The ekg ordered today demonstrates NSR, RBBB, LAFB   Recent Labs: No results found for requested labs within last 365 days.   Lipid Panel No results found for: "CHOL", "TRIG", "HDL", "CHOLHDL", "VLDL", "LDLCALC", "LDLDIRECT"   Other studies Reviewed: Additional  studies/ records that were reviewed today with results demonstrating: labs reviewed.   ASSESSMENT AND PLAN:  CAD: Continue aggressive secondary prevention.  No angina.  Refill Sl NTG.  Increase exercise to target noted below. Systolic murmur: Adequate valvular function noted in echocardiogram report from 2023. Primary hypertension: Avoid excessive salt. Hyperlipidemia: Whole food, plant-based diet.  High-fiber diet.  Avoid processed foods.  Stop simvastatin; start rosuvastatin 20 mg daily.  Check liver and lipid tests in 2 to 3 months. Right bundle branch block with left anterior  fascicular block: Bifascicular block. Chronic kidney disease: Stable hydrated.  Avoid nephrotoxins. Diabetes: Dietary recommendations noted above.  Exercise recommendations noted below.  Decrease sugar intake.  Eats a lot of bread.  Taking Jardiance 12.5 mg BID.   Current medicines are reviewed at length with the patient today.  The patient concerns regarding his medicines were addressed.  The following changes have been made:  No change  Labs/ tests ordered today include:   Orders Placed This Encounter  Procedures   EKG 12-Lead    Recommend 150 minutes/week of aerobic exercise Low fat, low carb, high fiber diet recommended  Disposition:   FU in 1 year with Dr. Anne Fu   Signed, Lance Muss, MD  05/01/2023 9:56 AM    [facility] [address] [place], [place], [place]  [address] Phone: [phone]; Fax: [phone]

## 2023-05-01 ENCOUNTER — Telehealth: Payer: Self-pay | Admitting: *Deleted

## 2023-05-01 ENCOUNTER — Ambulatory Visit: Payer: Medicare HMO | Attending: Interventional Cardiology | Admitting: Interventional Cardiology

## 2023-05-01 ENCOUNTER — Encounter: Payer: Self-pay | Admitting: Interventional Cardiology

## 2023-05-01 VITALS — BP 116/56 | HR 72 | Ht 71.5 in | Wt 308.0 lb

## 2023-05-01 DIAGNOSIS — I10 Essential (primary) hypertension: Secondary | ICD-10-CM

## 2023-05-01 DIAGNOSIS — I452 Bifascicular block: Secondary | ICD-10-CM | POA: Diagnosis not present

## 2023-05-01 DIAGNOSIS — E7849 Other hyperlipidemia: Secondary | ICD-10-CM | POA: Diagnosis not present

## 2023-05-01 DIAGNOSIS — I2510 Atherosclerotic heart disease of native coronary artery without angina pectoris: Secondary | ICD-10-CM | POA: Diagnosis not present

## 2023-05-01 MED ORDER — ROSUVASTATIN CALCIUM 20 MG PO TABS: 20.0000 mg | ORAL_TABLET | Freq: Every day | ORAL | 3 refills | Status: AC

## 2023-05-01 MED ORDER — NITROGLYCERIN 0.4 MG SL SUBL: 0.4000 mg | SUBLINGUAL_TABLET | SUBLINGUAL | 6 refills | Status: AC | PRN

## 2023-05-01 NOTE — Patient Instructions (Addendum)
Medication Instructions:  Your physician has recommended you make the following change in your medication:  Stop Simvastatin  Start Rosuvastatin 20 mg by mouth daily   *If you need a refill on your cardiac medications before your next appointment, please call your pharmacy*   Lab Work: Your physician recommends that you return for lab work on August 14, 2023.  Lipid and liver profiles.  This will be fasting.  The lab opens at 7:15 AM  If you have labs (blood work) drawn today and your tests are completely normal, you will receive your results only by: MyChart Message (if you have MyChart) OR A paper copy in the mail If you have any lab test that is abnormal or we need to change your treatment, we will call you to review the results.   Testing/Procedures: none   Follow-Up: At [facility], you and your health needs are our priority.  As part of our continuing mission to provide you with exceptional heart care, we have created designated Provider Care Teams.  These Care Teams include your primary Cardiologist (physician) and Advanced Practice Providers (APPs -  Physician Assistants and Nurse Practitioners) who all work together to provide you with the care you need, when you need it.  We recommend signing up for the patient portal called "MyChart".  Sign up information is provided on this After Visit Summary.  MyChart is used to connect with patients for Virtual Visits (Telemedicine).  Patients are able to view lab/test results, encounter notes, upcoming appointments, etc.  Non-urgent messages can be sent to your provider as well.   To learn more about what you can do with MyChart, go to [email or web address].    Your next appointment:   12 month(s)  Provider:   Dr Anne Fu     Other Instructions

## 2023-05-01 NOTE — Telephone Encounter (Signed)
-----   Message from [place] sent at 05/01/2023  3:23 PM EDT ----- Dennie Bible, can you check how much they are paying and to ask PMD if there are programs to help. Thanks. JV ----- Message ----- From: Awilda Metro, RPH-CPP Sent: 05/01/2023  11:05 AM EDT To: Corky Crafts, MD  I pulled up his formulary - both London Pepper and Marcelline Deist are Tier 3: copay of $47/1 month supply or $14/3 month supply. He is likely in the donut hole in which case his copay may be in the ~$120-130 range per month. Marcelline Deist went generic recently but it's still expensive (cheapest copay I see using GoodRx coupon is $241/month). He may qualify for patient assistance, it looks like his PCP is prescribing for DM, hopefully they can help with this. If not, our office should be able to.  Thanks, Aundra Millet ----- Message ----- From: Corky Crafts, MD Sent: 05/01/2023  10:55 AM EDT To: Awilda Metro, RPH-CPP  Would Marcelline Deist be more affordable?  London Pepper is expensive.

## 2023-05-01 NOTE — Telephone Encounter (Signed)
Left message to call office

## 2023-05-05 NOTE — Telephone Encounter (Signed)
I spoke with patient and his wife.  Patient is paying $250 for a 3 month supply of Jardiance.  Phone number given for Jardiance assistance program.  If patient applies he will contact PCP to complete paperwork.

## 2023-05-06 DIAGNOSIS — G4733 Obstructive sleep apnea (adult) (pediatric): Secondary | ICD-10-CM | POA: Diagnosis not present

## 2023-05-07 ENCOUNTER — Ambulatory Visit: Payer: Medicare HMO | Admitting: Podiatry

## 2023-05-07 ENCOUNTER — Encounter: Payer: Self-pay | Admitting: Podiatry

## 2023-05-07 DIAGNOSIS — L97511 Non-pressure chronic ulcer of other part of right foot limited to breakdown of skin: Secondary | ICD-10-CM

## 2023-05-07 DIAGNOSIS — E1149 Type 2 diabetes mellitus with other diabetic neurological complication: Secondary | ICD-10-CM

## 2023-05-07 DIAGNOSIS — B351 Tinea unguium: Secondary | ICD-10-CM

## 2023-05-08 ENCOUNTER — Encounter: Payer: Self-pay | Admitting: Podiatry

## 2023-05-08 NOTE — Progress Notes (Signed)
Subjective: No chief complaint on file.   89 y.o. returns the office today for painful, elongated, thickened toenails which he cannot trim himself.  States the side of the big toenail is starting to hurt left side.  No open lesions.  He does have a scab to the dorsal right third toe.  No drainage or pus.  No swelling.  PCP: Kirby Funk, MD   Objective: AAO 3, NAD DP/PT pulses palpable, CRT less than 3 seconds Sensation decreased with Semmes Weinstein monofilament. Nails are hypertrophic, dystrophic, brittle, discolored, elongated 10. No surrounding redness or drainage. Tenderness nails 1-5 bilaterally. No open lesions or pre-ulcerative lesions are identified today.  Scab to the right third toe appreciated which is stable appearance.  No surrounding erythema, no edema to the toe.  No warmth increase. Hammertoes present. No pain with calf compression, swelling, warmth, erythema.  Assessment: Patient presents with symptomatic onychomycosis Ulcer to dorsal right 2nd toe limited to breakdown of skin  Plan: -Treatment options including alternatives, risks, complications were discussed -Nails sharply debrided 10 without complication/bleeding.  -Continue to monitor scab closely.  Did recommend painting area with Betadine daily. -Continue daily foot inspection, glucose control. -Return to clinic in 3 months for diabetic footcare or sooner if new pedal complaints arise.  Follow-up with Dr. Jeralene Huff DPM

## 2023-05-26 DIAGNOSIS — M5126 Other intervertebral disc displacement, lumbar region: Secondary | ICD-10-CM | POA: Diagnosis not present

## 2023-05-26 DIAGNOSIS — Z6841 Body Mass Index (BMI) 40.0 and over, adult: Secondary | ICD-10-CM | POA: Diagnosis not present

## 2023-06-04 DIAGNOSIS — G4733 Obstructive sleep apnea (adult) (pediatric): Secondary | ICD-10-CM | POA: Diagnosis not present

## 2023-07-01 DIAGNOSIS — M48061 Spinal stenosis, lumbar region without neurogenic claudication: Secondary | ICD-10-CM | POA: Diagnosis not present

## 2023-07-01 DIAGNOSIS — M5126 Other intervertebral disc displacement, lumbar region: Secondary | ICD-10-CM | POA: Diagnosis not present

## 2023-07-01 DIAGNOSIS — M4319 Spondylolisthesis, multiple sites in spine: Secondary | ICD-10-CM | POA: Diagnosis not present

## 2023-07-01 DIAGNOSIS — M438X6 Other specified deforming dorsopathies, lumbar region: Secondary | ICD-10-CM | POA: Diagnosis not present

## 2023-07-01 DIAGNOSIS — M5127 Other intervertebral disc displacement, lumbosacral region: Secondary | ICD-10-CM | POA: Diagnosis not present

## 2023-07-05 DIAGNOSIS — G4733 Obstructive sleep apnea (adult) (pediatric): Secondary | ICD-10-CM | POA: Diagnosis not present

## 2023-07-30 DIAGNOSIS — Z6841 Body Mass Index (BMI) 40.0 and over, adult: Secondary | ICD-10-CM | POA: Diagnosis not present

## 2023-07-30 DIAGNOSIS — M5126 Other intervertebral disc displacement, lumbar region: Secondary | ICD-10-CM | POA: Diagnosis not present

## 2023-08-04 DIAGNOSIS — G4733 Obstructive sleep apnea (adult) (pediatric): Secondary | ICD-10-CM | POA: Diagnosis not present

## 2023-08-06 ENCOUNTER — Ambulatory Visit: Payer: Medicare HMO | Admitting: Podiatry

## 2023-08-06 ENCOUNTER — Encounter: Payer: Self-pay | Admitting: Podiatry

## 2023-08-06 DIAGNOSIS — M79674 Pain in right toe(s): Secondary | ICD-10-CM

## 2023-08-06 DIAGNOSIS — M79675 Pain in left toe(s): Secondary | ICD-10-CM | POA: Diagnosis not present

## 2023-08-06 DIAGNOSIS — E1149 Type 2 diabetes mellitus with other diabetic neurological complication: Secondary | ICD-10-CM | POA: Diagnosis not present

## 2023-08-06 DIAGNOSIS — B351 Tinea unguium: Secondary | ICD-10-CM | POA: Diagnosis not present

## 2023-08-06 NOTE — Progress Notes (Signed)
Subjective: Chief Complaint  Patient presents with   [facility]    RM#11 DFC dry skin on heels would like to have looked at.     89 y.o. returns the office today for painful, elongated, thickened toenails which he cannot trim himself.  He tried trimming toenails himself and causing bleeding left big toenail.  Denies any swelling or redness.    PCP: Kirby Funk, MD   Objective: AAO 3, NAD DP/PT pulses palpable, CRT less than 3 seconds Sensation decreased with Semmes Weinstein monofilament. Nails are hypertrophic, dystrophic, brittle, discolored, elongated 10.  Incurvation of the hallux toenails.  Dried blood present on the left hallux toenail.  There is no open lesions or lacerations noted today.  No surrounding redness or drainage. Tenderness nails 1-5 bilaterally. No open lesions or pre-ulcerative lesions are identified today.   Hammertoes present. No pain with calf compression, swelling, warmth, erythema.  Assessment: Patient presents with symptomatic onychomycosis  Plan: -Treatment options including alternatives, risks, complications were discussed -Nails sharply debrided 10 without complication/bleeding.  -Discussed trying to trim his toenails himself and needs to have them trimmed and we know particular on the area of ingrown toenails. -Daily foot inspection.  Return in about 9 weeks (around 10/08/2023) for routine care.  Vivi Barrack DPM

## 2023-08-14 ENCOUNTER — Other Ambulatory Visit: Payer: Medicare HMO

## 2023-08-24 DIAGNOSIS — G4733 Obstructive sleep apnea (adult) (pediatric): Secondary | ICD-10-CM | POA: Diagnosis not present

## 2023-09-04 DIAGNOSIS — G4733 Obstructive sleep apnea (adult) (pediatric): Secondary | ICD-10-CM | POA: Diagnosis not present

## 2023-10-05 DIAGNOSIS — G4733 Obstructive sleep apnea (adult) (pediatric): Secondary | ICD-10-CM | POA: Diagnosis not present

## 2023-10-08 ENCOUNTER — Encounter: Payer: Self-pay | Admitting: Podiatry

## 2023-10-08 ENCOUNTER — Ambulatory Visit: Payer: Medicare HMO | Admitting: Podiatry

## 2023-10-08 DIAGNOSIS — B351 Tinea unguium: Secondary | ICD-10-CM

## 2023-10-08 DIAGNOSIS — E1149 Type 2 diabetes mellitus with other diabetic neurological complication: Secondary | ICD-10-CM

## 2023-10-08 DIAGNOSIS — M79675 Pain in left toe(s): Secondary | ICD-10-CM

## 2023-10-08 DIAGNOSIS — M79674 Pain in right toe(s): Secondary | ICD-10-CM | POA: Diagnosis not present

## 2023-10-08 NOTE — Patient Instructions (Signed)

## 2023-10-09 NOTE — Progress Notes (Signed)
 Subjective: Chief Complaint  Patient presents with   RFC    RM#11 RFC      89 y.o. returns the office today for painful, elongated, thickened toenails which he cannot trim himself.  States he has pain to the big toenails occasionally keep iodine on them.  This is an ongoing issue.  Denies any drainage or pus.  No injuries. PCP: Kirby Funk, MD   Objective: AAO 3, NAD DP/PT pulses palpable, CRT less than 3 seconds Sensation decreased with Semmes Weinstein monofilament. Nails are hypertrophic, dystrophic, brittle, discolored, elongated 10.  Upon debridement is very minimal clear drainage expressed on the distal portion of the toenail but there is no edema, erythema or signs of infection.  No other open lesion identified.  He has tenderness nails 1-5 bilaterally.  Hammertoes present. No pain with calf compression, swelling, warmth, erythema.  Assessment: Patient presents with symptomatic onychomycosis  Plan: -Treatment options including alternatives, risks, complications were discussed -Nails sharply debrided 10 without complication/bleeding.  Minimal clear drainage noted on the right hallux.  Recommend endoform dressing changes daily.  He can also use iodine if needed.  Discussed Epsom salt soaks but have his wife check the temperature of the water and dry thoroughly afterwards.  Monitor for any signs or symptoms of infection. -Daily foot inspection.  Return in about 3 months (around 01/05/2024).   Vivi Barrack DPM

## 2023-10-12 DIAGNOSIS — M25512 Pain in left shoulder: Secondary | ICD-10-CM | POA: Diagnosis not present

## 2023-10-12 DIAGNOSIS — M7522 Bicipital tendinitis, left shoulder: Secondary | ICD-10-CM | POA: Diagnosis not present

## 2023-11-02 DIAGNOSIS — G4733 Obstructive sleep apnea (adult) (pediatric): Secondary | ICD-10-CM | POA: Diagnosis not present

## 2023-12-03 DIAGNOSIS — G4733 Obstructive sleep apnea (adult) (pediatric): Secondary | ICD-10-CM | POA: Diagnosis not present

## 2024-01-02 DIAGNOSIS — G4733 Obstructive sleep apnea (adult) (pediatric): Secondary | ICD-10-CM | POA: Diagnosis not present

## 2024-01-05 ENCOUNTER — Ambulatory Visit: Payer: Medicare HMO | Admitting: Podiatry

## 2024-01-05 DIAGNOSIS — M79674 Pain in right toe(s): Secondary | ICD-10-CM | POA: Diagnosis not present

## 2024-01-05 DIAGNOSIS — E1149 Type 2 diabetes mellitus with other diabetic neurological complication: Secondary | ICD-10-CM | POA: Diagnosis not present

## 2024-01-05 DIAGNOSIS — M79675 Pain in left toe(s): Secondary | ICD-10-CM | POA: Diagnosis not present

## 2024-01-05 DIAGNOSIS — B351 Tinea unguium: Secondary | ICD-10-CM

## 2024-01-05 MED ORDER — CICLOPIROX 8 % EX SOLN: Freq: Every day | CUTANEOUS | 2 refills | Status: AC

## 2024-01-06 NOTE — Progress Notes (Signed)
 Subjective: Chief Complaint  Patient presents with   Nail Problem    Patient is here for routine foot care, nail trim and nail fungus.      89 y.o. returns the office today for painful, elongated, thickened toenails which he cannot trim himself.  States he been using hydrogen peroxide in the toenails which did help with some of the discoloration.  No open lesions at this time.  PCP: Lysle Saunas, MD   Objective: AAO 3, NAD DP/PT pulses palpable, CRT less than 3 seconds Sensation decreased with Semmes Weinstein monofilament. Nails are hypertrophic, dystrophic, brittle, discolored, elongated 10, most significantly bilateral hallux toenails.  No drainage noted to the toenails today.  There is no edema, erythema.  He has tenderness nails 1-5 bilaterally.  Hammertoes present. No pain with calf compression, swelling, warmth, erythema.  Assessment: Patient presents with symptomatic onychomycosis  Plan: -Treatment options including alternatives, risks, complications were discussed -Nails sharply debrided 10 without complication/bleeding.  No drainage or purulence noted today. Prescribed ciclopirox -Daily foot inspection  Return in about 3 months (around 04/06/2024).  Charity Conch DPM

## 2024-02-02 DIAGNOSIS — L905 Scar conditions and fibrosis of skin: Secondary | ICD-10-CM | POA: Diagnosis not present

## 2024-02-02 DIAGNOSIS — L309 Dermatitis, unspecified: Secondary | ICD-10-CM | POA: Diagnosis not present

## 2024-02-02 DIAGNOSIS — L918 Other hypertrophic disorders of the skin: Secondary | ICD-10-CM | POA: Diagnosis not present

## 2024-02-02 DIAGNOSIS — Z85828 Personal history of other malignant neoplasm of skin: Secondary | ICD-10-CM | POA: Diagnosis not present

## 2024-02-02 DIAGNOSIS — G4733 Obstructive sleep apnea (adult) (pediatric): Secondary | ICD-10-CM | POA: Diagnosis not present

## 2024-03-03 DIAGNOSIS — G4733 Obstructive sleep apnea (adult) (pediatric): Secondary | ICD-10-CM | POA: Diagnosis not present

## 2024-03-21 DIAGNOSIS — H524 Presbyopia: Secondary | ICD-10-CM | POA: Diagnosis not present

## 2024-03-21 DIAGNOSIS — E119 Type 2 diabetes mellitus without complications: Secondary | ICD-10-CM | POA: Diagnosis not present

## 2024-03-21 DIAGNOSIS — Z961 Presence of intraocular lens: Secondary | ICD-10-CM | POA: Diagnosis not present

## 2024-04-03 DIAGNOSIS — G4733 Obstructive sleep apnea (adult) (pediatric): Secondary | ICD-10-CM | POA: Diagnosis not present

## 2024-04-07 ENCOUNTER — Encounter: Payer: Self-pay | Admitting: Podiatry

## 2024-04-07 ENCOUNTER — Ambulatory Visit: Admitting: Podiatry

## 2024-04-07 VITALS — Ht 71.5 in | Wt 308.0 lb

## 2024-04-07 DIAGNOSIS — E1149 Type 2 diabetes mellitus with other diabetic neurological complication: Secondary | ICD-10-CM | POA: Diagnosis not present

## 2024-04-07 DIAGNOSIS — B351 Tinea unguium: Secondary | ICD-10-CM | POA: Diagnosis not present

## 2024-04-07 DIAGNOSIS — M79674 Pain in right toe(s): Secondary | ICD-10-CM

## 2024-04-07 DIAGNOSIS — M79675 Pain in left toe(s): Secondary | ICD-10-CM | POA: Diagnosis not present

## 2024-04-07 NOTE — Progress Notes (Signed)
 Subjective: Chief Complaint  Patient presents with   Nail Problem    Pt is here for RFC.    89 y.o. returns the office today for painful, elongated, thickened toenails which he cannot trim himself.  States he been using hydrogen peroxide in the toenails which did help with some of the discoloration.  He does not report any new concerns otherwise.  PCP: Signa Rush, MD   Objective: AAO 3, NAD DP/PT pulses palpable, CRT less than 3 seconds Sensation decreased with Semmes Weinstein monofilament. Nails are hypertrophic, dystrophic, brittle, discolored, elongated 10, most significantly bilateral hallux toenails.  No drainage noted to the toenails today.  There is no edema, erythema.  He has tenderness nails 1-5 bilaterally.  There is some clearing of the proximal nail folds of the hallux. Hammertoes present. No pain with calf compression, swelling, warmth, erythema.  Assessment: Patient presents with symptomatic onychomycosis  Plan: -Treatment options including alternatives, risks, complications were discussed -Nails sharply debrided 10 without complication/bleeding.  No drainage or purulence noted today. -Previously prescribed ciclopirox . -Daily foot inspection  Return in about 3 months (around 07/08/2024).   Charles Bonilla DPM

## 2024-06-21 DIAGNOSIS — L570 Actinic keratosis: Secondary | ICD-10-CM | POA: Diagnosis not present

## 2024-06-21 DIAGNOSIS — I8311 Varicose veins of right lower extremity with inflammation: Secondary | ICD-10-CM | POA: Diagnosis not present

## 2024-06-21 DIAGNOSIS — L821 Other seborrheic keratosis: Secondary | ICD-10-CM | POA: Diagnosis not present

## 2024-06-21 DIAGNOSIS — I872 Venous insufficiency (chronic) (peripheral): Secondary | ICD-10-CM | POA: Diagnosis not present

## 2024-06-21 DIAGNOSIS — I8312 Varicose veins of left lower extremity with inflammation: Secondary | ICD-10-CM | POA: Diagnosis not present

## 2024-06-23 ENCOUNTER — Ambulatory Visit (HOSPITAL_BASED_OUTPATIENT_CLINIC_OR_DEPARTMENT_OTHER): Admitting: Cardiology

## 2024-06-23 ENCOUNTER — Encounter (HOSPITAL_BASED_OUTPATIENT_CLINIC_OR_DEPARTMENT_OTHER): Payer: Self-pay | Admitting: Cardiology

## 2024-06-23 VITALS — BP 126/60 | HR 76 | Ht 71.5 in | Wt 307.4 lb

## 2024-06-23 DIAGNOSIS — I2510 Atherosclerotic heart disease of native coronary artery without angina pectoris: Secondary | ICD-10-CM

## 2024-06-23 DIAGNOSIS — I10 Essential (primary) hypertension: Secondary | ICD-10-CM

## 2024-06-23 DIAGNOSIS — E7849 Other hyperlipidemia: Secondary | ICD-10-CM

## 2024-06-23 NOTE — Progress Notes (Signed)
 Cardiology Office Note:  .   Date:  06/23/2024  ID:  Charles Bonilla, DOB 1934-03-08, MRN [id number] PCP: Dwight Trula SQUIBB, MD  [facility] Providers Cardiologist:  Oneil Parchment, MD    History of Present Illness: .   Charles Bonilla is a 89 y.o. male Discussed the use of AI scribe   History of Present Illness Charles Bonilla is an 89 year old male with coronary artery disease who presents for follow-up. He is a former patient of Dr. Claudene.  He has a history of coronary artery disease with a bare metal stent placed in June 2003. Over a month ago, he experienced cold sweats and extreme weakness, which resolved after resting, with no similar episodes since. No recent episodes of chest pain or fainting have occurred.  His medical history includes hypertension, hyperlipidemia, obstructive sleep apnea, type 2 diabetes, and morbid obesity. Hypertension is managed with amlodipine  5 mg daily. For diabetes, he takes Jardiance 25 mg and metformin . Hyperlipidemia is controlled with Crestor  (rosuvastatin ) 20 mg, and his LDL is 49. He also takes spironolactone  25 mg daily.  An echocardiogram from June 10, 2022, showed aortic valve sclerosis without stenosis, a mean valve gradient of 8, mild dilation of the ascending aorta at 40 mm, and an ejection fraction of 75%. An EKG shows right bundle branch block and left anterior fascicular block, which are stable.  His A1c was 8.2 in October, indicating elevated blood sugar levels.  He is a former resident of [place] and has family there.   ROS: No syncope  Studies Reviewed: SABRA   EKG Interpretation Date/Time:  Thursday June 23 2024 11:59:31 EST Ventricular Rate:  78 PR Interval:  162 QRS Duration:  152 QT Interval:  450 QTC Calculation: 513 R Axis:   -72  Text Interpretation: Normal sinus rhythm Right bundle branch block Left anterior fascicular block Bifascicular block When compared with ECG of 01-May-2023 09:40, No significant change was  found Confirmed by Parchment Oneil ([id number]) on 06/23/2024 12:01:27 PM    Results LABS LDL: 49 A1c: 8.2 (05/2024)  DIAGNOSTIC EKG: Right bundle branch block, left anterior fascicular block Echocardiogram: Aortic valve sclerosis without stenosis, mean valve gradient 8 mmHg, mild dilation of ascending aorta at 40 mm, EF 75% (06/10/2022) Risk Assessment/Calculations:            Physical Exam:   VS:  BP 126/60   Pulse 76   Ht 5' 11.5 (1.816 m)   Wt (!) 307 lb 6.4 oz (139.4 kg)   SpO2 97%   BMI 42.28 kg/m    Wt Readings from Last 3 Encounters:  06/23/24 (!) 307 lb 6.4 oz (139.4 kg)  04/07/24 (!) 308 lb (139.7 kg)  05/01/23 (!) 308 lb (139.7 kg)    GEN: Well nourished, well developed in no acute distress NECK: No JVD; No carotid bruits CARDIAC: RRR, no murmurs, no rubs, no gallops RESPIRATORY:  Clear to auscultation without rales, wheezing or rhonchi  ABDOMEN: Soft, non-tender, non-distended EXTREMITIES:  No edema; No deformity   ASSESSMENT AND PLAN: .    Assessment and Plan Assessment & Plan Coronary artery disease, status post bare metal stent Coronary artery disease is well-managed with no new symptoms. EKG shows right bundle branch block and left anterior fascicular block, consistent with previous findings. No recent episodes of syncope or significant cardiac events. Previous episode of cold sweats and weakness likely due to dehydration or viral illness, if this returns, can always consider further cardiac testing.  May have been hypoglycemia as well. - Continue current management and medications. - Encouraged regular physical activity as tolerated.  Essential hypertension Hypertension is well-controlled with current medication regimen. - Continue amlodipine  5 mg daily.  Type 2 diabetes mellitus Type 2 diabetes is managed with Jardiance and metformin . Recent A1c was 8.2, indicating suboptimal control. No changes in management at this time. - Continue Jardiance 25 mg  daily. - Continue metformin  as prescribed.  Hyperlipidemia Well-controlled with rosuvastatin . LDL is at 49, which is excellent. - Continue rosuvastatin  20 mg daily.  Morbid obesity Encouraged to maintain physical activity as tolerated. - Encouraged regular physical activity as tolerated.  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 Mean valve gradient was 8. No intervention required at this time. - Continue monitoring with regular follow-up.  Mild dilation of ascending aorta Mild dilation of the ascending aorta noted at 40 mm on echocardiogram. No immediate intervention required. - Continue monitoring with regular follow-up.  Chronic kidney disease Present with slightly sluggish kidney function. No acute changes noted. - Continue monitoring kidney function.         Dispo: 1 yr  Signed, Oneil Parchment, MD

## 2024-06-23 NOTE — Patient Instructions (Signed)
 Medication Instructions:  No changes *If you need a refill on your cardiac medications before your next appointment, please call your pharmacy*  Lab Work: none  Testing/Procedures: none  Follow-Up: At [facility], you and your health needs are our priority.  As part of our continuing mission to provide you with exceptional heart care, our providers are all part of one team.  This team includes your primary Cardiologist (physician) and Advanced Practice Providers or APPs (Physician Assistants and Nurse Practitioners) who all work together to provide you with the care you need, when you need it.  Your next appointment:   12 month(s)  Provider:   Oneil Parchment, MD, Rosaline Bane, NP, or Reche Finder, NP

## 2024-07-04 ENCOUNTER — Ambulatory Visit: Admitting: Podiatry

## 2024-07-04 DIAGNOSIS — M79675 Pain in left toe(s): Secondary | ICD-10-CM

## 2024-07-04 DIAGNOSIS — M79674 Pain in right toe(s): Secondary | ICD-10-CM | POA: Diagnosis not present

## 2024-07-04 DIAGNOSIS — E1149 Type 2 diabetes mellitus with other diabetic neurological complication: Secondary | ICD-10-CM

## 2024-07-04 DIAGNOSIS — B351 Tinea unguium: Secondary | ICD-10-CM | POA: Diagnosis not present

## 2024-07-06 NOTE — Progress Notes (Signed)
 Subjective: Chief Complaint  Patient presents with   Diabetes    DFC A1C 7.5. Toenail trim     89 y.o. returns the office today for painful, elongated, thickened toenails which he cannot trim himself.  The big toenails get ingrown as they become elongated. He does not report any new concerns otherwise.  PCP: Dwight Trula SQUIBB, MD   Objective: AAO 3, NAD DP/PT pulses palpable, CRT less than 3 seconds Sensation decreased with Semmes Weinstein monofilament. Nails are hypertrophic, dystrophic, brittle, discolored, elongated 10, most significantly bilateral hallux toenails.  No drainage noted to the toenails today.  There is no edema, erythema.  He has tenderness nails 1-5 bilaterally.  Mild ingrowing of the hallux toenails without any signs of infection.  Hammertoes present. No pain with calf compression, swelling, warmth, erythema.  Assessment: Patient presents with symptomatic onychomycosis  Plan: -Treatment options including alternatives, risks, complications were discussed -Nails sharply debrided 10 without complication/bleeding.  No drainage or purulence noted today. -Daily foot inspection  Return in about 3 months (around 10/04/2024).  Donnice JONELLE Fees DPM

## 2024-07-29 DIAGNOSIS — E1140 Type 2 diabetes mellitus with diabetic neuropathy, unspecified: Secondary | ICD-10-CM | POA: Diagnosis not present

## 2024-07-29 DIAGNOSIS — I10 Essential (primary) hypertension: Secondary | ICD-10-CM | POA: Diagnosis not present

## 2024-07-29 DIAGNOSIS — H5713 Ocular pain, bilateral: Secondary | ICD-10-CM | POA: Diagnosis not present

## 2024-10-03 ENCOUNTER — Ambulatory Visit: Admitting: Podiatry
